# Patient Record
Sex: Female | Born: 1965
Health system: Southern US, Community
[De-identification: ages and names within clinical notes are randomized; demographics above are authoritative.]

## PROBLEM LIST (undated history)

## (undated) DIAGNOSIS — I10 Essential (primary) hypertension: Secondary | ICD-10-CM

## (undated) DIAGNOSIS — E785 Hyperlipidemia, unspecified: Secondary | ICD-10-CM

## (undated) DIAGNOSIS — N390 Urinary tract infection, site not specified: Secondary | ICD-10-CM

## (undated) HISTORY — DX: Hyperlipidemia, unspecified: E78.5

## (undated) HISTORY — DX: Urinary tract infection, site not specified: N39.0

## (undated) HISTORY — PX: HERNIA REPAIR: SHX51

---

## 1998-05-17 ENCOUNTER — Other Ambulatory Visit: Admission: RE | Admit: 1998-05-17 | Discharge: 1998-05-17 | Payer: Self-pay | Admitting: Gynecology

## 1999-06-17 ENCOUNTER — Other Ambulatory Visit: Admission: RE | Admit: 1999-06-17 | Discharge: 1999-06-17 | Payer: Self-pay | Admitting: Gynecology

## 2000-06-18 ENCOUNTER — Other Ambulatory Visit: Admission: RE | Admit: 2000-06-18 | Discharge: 2000-06-18 | Payer: Self-pay | Admitting: Gynecology

## 2001-02-18 ENCOUNTER — Emergency Department (HOSPITAL_COMMUNITY): Admission: EM | Admit: 2001-02-18 | Discharge: 2001-02-18 | Payer: Self-pay | Admitting: Emergency Medicine

## 2001-07-05 ENCOUNTER — Other Ambulatory Visit: Admission: RE | Admit: 2001-07-05 | Discharge: 2001-07-05 | Payer: Self-pay | Admitting: Gynecology

## 2002-12-20 ENCOUNTER — Other Ambulatory Visit: Admission: RE | Admit: 2002-12-20 | Discharge: 2002-12-20 | Payer: Self-pay | Admitting: Obstetrics and Gynecology

## 2003-05-29 ENCOUNTER — Encounter: Payer: Self-pay | Admitting: Obstetrics and Gynecology

## 2003-05-29 ENCOUNTER — Encounter: Admission: RE | Admit: 2003-05-29 | Discharge: 2003-05-29 | Payer: Self-pay | Admitting: Obstetrics and Gynecology

## 2003-07-16 ENCOUNTER — Emergency Department (HOSPITAL_COMMUNITY): Admission: EM | Admit: 2003-07-16 | Discharge: 2003-07-16 | Payer: Self-pay | Admitting: Emergency Medicine

## 2003-07-27 ENCOUNTER — Inpatient Hospital Stay (HOSPITAL_COMMUNITY): Admission: AD | Admit: 2003-07-27 | Discharge: 2003-07-27 | Payer: Self-pay | Admitting: Obstetrics and Gynecology

## 2004-02-27 ENCOUNTER — Inpatient Hospital Stay (HOSPITAL_COMMUNITY): Admission: AD | Admit: 2004-02-27 | Discharge: 2004-03-01 | Payer: Self-pay | Admitting: Obstetrics and Gynecology

## 2004-03-27 ENCOUNTER — Other Ambulatory Visit: Admission: RE | Admit: 2004-03-27 | Discharge: 2004-03-27 | Payer: Self-pay | Admitting: Obstetrics and Gynecology

## 2011-05-14 ENCOUNTER — Encounter: Payer: Self-pay | Admitting: Family Medicine

## 2011-05-14 DIAGNOSIS — N390 Urinary tract infection, site not specified: Secondary | ICD-10-CM | POA: Insufficient documentation

## 2013-06-24 ENCOUNTER — Encounter: Payer: Self-pay | Admitting: Family Medicine

## 2013-06-24 ENCOUNTER — Ambulatory Visit (INDEPENDENT_AMBULATORY_CARE_PROVIDER_SITE_OTHER): Payer: 59 | Admitting: Family Medicine

## 2013-06-24 VITALS — BP 120/74 | HR 86 | Temp 99.0°F | Resp 16 | Wt 160.0 lb

## 2013-06-24 DIAGNOSIS — R350 Frequency of micturition: Secondary | ICD-10-CM

## 2013-06-24 DIAGNOSIS — M545 Low back pain: Secondary | ICD-10-CM

## 2013-06-24 LAB — URINALYSIS, ROUTINE W REFLEX MICROSCOPIC
Bilirubin Urine: NEGATIVE
Glucose, UA: NEGATIVE mg/dL
Hgb urine dipstick: NEGATIVE
Leukocytes, UA: NEGATIVE

## 2013-06-24 MED ORDER — MELOXICAM 15 MG PO TABS: 15.0000 mg | ORAL_TABLET | Freq: Every day | ORAL | Status: DC

## 2013-06-24 MED ORDER — CYCLOBENZAPRINE HCL 10 MG PO TABS: 10.0000 mg | ORAL_TABLET | Freq: Three times a day (TID) | ORAL | Status: DC | PRN

## 2013-06-24 NOTE — Progress Notes (Signed)
Subjective:    Patient ID: Caitlin Henderson, female    DOB: March 31, 1966, 47 y.o.   MRN: [id number]  HPI Patient reports 2 weeks of low back pain in her right lower flank. It is worse with movement. It is worse with getting up from a seated position. It hurts to bend over. She denies any numbness or stinging radiating down either leg. She denies any symptoms of cauda equina syndrome. She denies any injury to her back. She denies any fevers or chills. She has had urinary frequency over last couple days. She denies any dysuria. She denies any hesitancy. She denies any pelvic pressure, nausea, vomiting. She saw her gynecologist 2 days ago where urinalysis was negative. Today in the office the urinalysis is also negative. See below: Office Visit on 06/24/2013  Component Date Value Range Status  . Color, Urine 06/24/2013 YELLOW  YELLOW Final  . APPearance 06/24/2013 CLEAR  CLEAR Final  . Specific Gravity, Urine 06/24/2013 1.020  1.005 - 1.030 Final  . pH 06/24/2013 7.0  5.0 - 8.0 Final  . Glucose, UA 06/24/2013 NEG  NEG mg/dL Final  . Bilirubin Urine 06/24/2013 NEG  NEG Final  . Ketones, ur 06/24/2013 NEG  NEG mg/dL Final  . Hgb urine dipstick 06/24/2013 NEG  NEG Final  . Protein, ur 06/24/2013 NEG  NEG mg/dL Final  . Urobilinogen, UA 06/24/2013 1  0.0 - 1.0 mg/dL Final  . Nitrite 40/98/1191 NEG  NEG Final  . Leukocytes, UA 06/24/2013 NEG  NEG Final   Past Medical History  Diagnosis Date  . Frequent UTI    No current outpatient prescriptions on file prior to visit.   No current facility-administered medications on file prior to visit.   Allergies  Allergen Reactions  . Bee Venom    History   Social History  . Marital Status: Married    Spouse Name: N/A    Number of Children: N/A  . Years of Education: N/A   Occupational History  . Not on file.   Social History Main Topics  . Smoking status: Never Smoker   . Smokeless tobacco: Not on file  . Alcohol Use: No  . Drug Use: No  .  Sexual Activity: Not on file   Other Topics Concern  . Not on file   Social History Narrative  . No narrative on file      Review of Systems  All other systems reviewed and are negative.       Objective:   Physical Exam  Vitals reviewed. Constitutional: She is oriented to person, place, and time.  Cardiovascular: Normal rate, regular rhythm and normal heart sounds.   No murmur heard. Pulmonary/Chest: Effort normal and breath sounds normal. No respiratory distress. She has no wheezes. She has no rales.  Abdominal: Soft. Bowel sounds are normal. She exhibits no distension. There is no tenderness. There is no rebound and no guarding.  Musculoskeletal:       Lumbar back: She exhibits tenderness, pain and spasm. She exhibits normal range of motion, no bony tenderness, no swelling and no edema.  Neurological: She is alert and oriented to person, place, and time. She has normal reflexes. She displays normal reflexes. No cranial nerve deficit. She exhibits normal muscle tone. Coordination normal.   no CVAT, patient has straight leg raise.        Assessment & Plan:  1. Frequent urination No evidence of UTI - Urinalysis, Routine w reflex microscopic  2. Low back pain Low back pain  appears to be a muscle strain. Recommend tincture of time for 2 weeks. Meanwhile begin meloxicam 15 mg by mouth daily and Flexeril 10 mg by mouth every 8 hours when necessary muscle spasm. Recheck in 2 weeks if no better or sooner if worse. Proceed with x-rays in 2 weeks if symptoms are no better. - meloxicam (MOBIC) 15 MG tablet; Take 1 tablet (15 mg total) by mouth daily.  Dispense: 30 tablet; Refill: 0 - cyclobenzaprine (FLEXERIL) 10 MG tablet; Take 1 tablet (10 mg total) by mouth 3 (three) times daily as needed for muscle spasms (take 1/2 a pill to a whole pill).  Dispense: 30 tablet; Refill: 0

## 2015-05-03 ENCOUNTER — Ambulatory Visit (INDEPENDENT_AMBULATORY_CARE_PROVIDER_SITE_OTHER): Payer: 59 | Admitting: Physician Assistant

## 2015-05-03 ENCOUNTER — Encounter: Payer: Self-pay | Admitting: Physician Assistant

## 2015-05-03 VITALS — BP 132/68 | HR 76 | Temp 98.1°F | Resp 14 | Ht 62.0 in | Wt 183.0 lb

## 2015-05-03 DIAGNOSIS — L200 Besnier's prurigo: Secondary | ICD-10-CM | POA: Diagnosis not present

## 2015-05-03 DIAGNOSIS — L239 Allergic contact dermatitis, unspecified cause: Secondary | ICD-10-CM

## 2015-05-03 MED ORDER — METHYLPREDNISOLONE ACETATE 40 MG/ML IJ SUSP
60.0000 mg | Freq: Once | INTRAMUSCULAR | Status: AC
  Administered 2015-05-03: 60 mg via INTRAMUSCULAR

## 2015-05-03 NOTE — Progress Notes (Signed)
    Patient ID: Tiyah Zelenak MRN: [id number], DOB: 04-Dec-1965, 49 y.o. Date of Encounter: 05/03/2015, 9:01 AM    Chief Complaint:  Chief Complaint  Patient presents with  . Skin Irriation    x2 days- thinks its poison ivy- has irritation on hands, wrist, arms, face and the corner of R eye     HPI: 49 y.o. year old white female presents with above. Has fine bumpy rash on both arms and hands. Also some splotches on her for head cheek and neck. Very itchy. Says that she is quite certain that she got into some poison ivy in her yard. Says that she prefers to get a shot if possible rather than having to take pills.     Home Meds:   Outpatient Prescriptions Prior to Visit  Medication Sig Dispense Refill  . sulfacetamide (BLEPH-10) 10 % ophthalmic ointment Place into both eyes every 6 (six) hours.    . cyclobenzaprine (FLEXERIL) 10 MG tablet Take 1 tablet (10 mg total) by mouth 3 (three) times daily as needed for muscle spasms (take 1/2 a pill to a whole pill). (Patient not taking: Reported on 05/03/2015) 30 tablet 0  . meloxicam (MOBIC) 15 MG tablet Take 1 tablet (15 mg total) by mouth daily. (Patient not taking: Reported on 05/03/2015) 30 tablet 0   No facility-administered medications prior to visit.    Allergies:  Allergies  Allergen Reactions  . Bee Venom       Review of Systems: See HPI for pertinent ROS. All other ROS negative.    Physical Exam: Blood pressure 132/68, pulse 76, temperature 98.1 F (36.7 C), temperature source Oral, resp. rate 14, height 5\' 2"  (1.575 m), weight 183 lb (83.008 kg), last menstrual period 04/08/2015., Body mass index is 33.46 kg/(m^2). General:  Overweight white female .Appears in no acute distress. Neck: Supple. No thyromegaly. No lymphadenopathy. Lungs: Clear bilaterally to auscultation without wheezes, rales, or rhonchi. Breathing is unlabored. Heart: Regular rhythm. No murmurs, rubs, or gallops. Msk:  Strength and tone normal for age. Skin:  Small, fine pink papules on both hands and  forearms and then only a few scattered on her upper arms. Also splotches of pink papules on her forehead and left cheek and left neck. Neuro: Alert and oriented X 3. Moves all extremities spontaneously. Gait is normal. CNII-XII grossly in tact. Psych:  Responds to questions appropriately with a normal affect.     ASSESSMENT AND PLAN:  49 y.o. year old female with    1. Allergic dermatitis Will give Depo-Medrol 60 mg IM now. Told her to call us if rash does not resolve or if it starts to resolve then returns. She says that she works at [employer]. Says that she will bring in FMLA form for me to complete. Says that she plans to just be out today but possibly tomorrow depending on how the medicine affects her. Says that when she brings in FMLA forms, she will provide me that information. Says that at her job she can turn in [place] forms even if she is only out for 1 day. - methylPREDNISolone acetate (DEPO-MEDROL) injection 60 mg; Inject 1.5 mLs (60 mg total) into the muscle once.   Signed, [address] [place], [place], [facility] 05/03/2015 9:01 AM

## 2015-05-07 ENCOUNTER — Telehealth: Payer: Self-pay | Admitting: Family Medicine

## 2015-05-07 NOTE — Telephone Encounter (Signed)
[phone] Pt dropped off FMLA ppw and it has been routed to [place]

## 2015-05-08 NOTE — Telephone Encounter (Signed)
Received FMLA ppw on my desk form pt, pt states the form is for work missed due to poison ivy rash on arms, hands and face, and rt eye.  Pt was out of work thurs 07/07 and Fri 07/08  Pt job title: Merchant navy officer   Job duties: office work, Teaching laboratory technician work, walking and sitting  Job hours: 8 hour shift 9-5p  I have routed the ppw to [employer]

## 2015-05-09 NOTE — Telephone Encounter (Signed)
Received ppw contacted pt to inform FMLA is ready for pickup and needs to pay the form fee.Caitlin Henderson

## 2015-05-09 NOTE — Telephone Encounter (Signed)
FMLA form completed. Given to [place] to copy to scan and to turn in to pt/employer.

## 2015-05-18 NOTE — Telephone Encounter (Signed)
piad

## 2015-06-07 ENCOUNTER — Ambulatory Visit (INDEPENDENT_AMBULATORY_CARE_PROVIDER_SITE_OTHER): Payer: 59 | Admitting: Physician Assistant

## 2015-06-07 ENCOUNTER — Encounter: Payer: Self-pay | Admitting: Physician Assistant

## 2015-06-07 VITALS — BP 140/82 | HR 88 | Temp 98.4°F | Resp 16 | Ht 62.0 in | Wt 183.0 lb

## 2015-06-07 DIAGNOSIS — I10 Essential (primary) hypertension: Secondary | ICD-10-CM | POA: Diagnosis not present

## 2015-06-07 MED ORDER — LISINOPRIL 10 MG PO TABS: 10.0000 mg | ORAL_TABLET | Freq: Every day | ORAL | Status: DC

## 2015-06-07 NOTE — Progress Notes (Signed)
    Patient ID: Caitlin Henderson MRN: [id number], DOB: 07-Jan-1966, 49 y.o. Date of Encounter: 06/07/2015, 4:54 PM    Chief Complaint:  Chief Complaint  Patient presents with  . Elevated BP    states that she has elevated BP for months (150's)     HPI: 49 y.o. year old white female presents with above.  Says that she works at [employer]. They occasionally check blood pressure there. Couple of times blood pressure readings there were 150s over 100.  Says that last month gynecologist did a hysteroscopy. Says that prior to and during the procedure her blood pressure was reading about 158/100 157/100. That when she went for post procedure follow-up blood pressure was 148/100.  Says she check blood pressure this morning and was 162/100.  Says that both of her parents have high blood pressure and her grandmothers have high blood pressure.     Home Meds:   Outpatient Prescriptions Prior to Visit  Medication Sig Dispense Refill  . sulfacetamide (BLEPH-10) 10 % ophthalmic ointment Place into both eyes every 6 (six) hours.     No facility-administered medications prior to visit.    Allergies:  Allergies  Allergen Reactions  . Bee Venom       Review of Systems: See HPI for pertinent ROS. All other ROS negative.    Physical Exam: Blood pressure 140/82, pulse 88, temperature 98.4 F (36.9 C), temperature source Oral, resp. rate 16, height 5\' 2"  (1.575 m), weight 183 lb (83.008 kg), last menstrual period 05/07/2015., Body mass index is 33.46 kg/(m^2). General:  WNWD WF. Appears in no acute distress. Neck: Supple. No thyromegaly. No lymphadenopathy. Lungs: Clear bilaterally to auscultation without wheezes, rales, or rhonchi. Breathing is unlabored. Heart: Regular rhythm. No murmurs, rubs, or gallops. Msk:  Strength and tone normal for age. Extremities/Skin: Warm and dry.  No edema.  Neuro: Alert and oriented X 3. Moves all extremities spontaneously. Gait is normal. CNII-XII  grossly in tact. Psych:  Responds to questions appropriately with a normal affect.     ASSESSMENT AND PLAN:  49 y.o. year old female with  1. Essential hypertension - lisinopril (PRINIVIL,ZESTRIL) 10 MG tablet; Take 1 tablet (10 mg total) by mouth daily.  Dispense: 30 tablet; Refill: 0 She is to take lisinopril 1 each morning. She is to schedule follow-up office visit here in 2 weeks to recheck blood pressure and also check BMP T on medication. Also recommend that she check blood pressure a couple of times at work a couple of days prior to that visit so that we will have several readings.  [address] [place], [place], [facility] 06/07/2015 4:54 PM

## 2015-06-10 ENCOUNTER — Encounter (HOSPITAL_COMMUNITY): Payer: Self-pay | Admitting: *Deleted

## 2015-06-10 ENCOUNTER — Emergency Department (HOSPITAL_COMMUNITY): Payer: 59

## 2015-06-10 ENCOUNTER — Emergency Department (HOSPITAL_COMMUNITY)
Admission: EM | Admit: 2015-06-10 | Discharge: 2015-06-10 | Disposition: A | Discharge status: Home / Self Care | Payer: 59 | Attending: Emergency Medicine | Admitting: Emergency Medicine

## 2015-06-10 DIAGNOSIS — Y9389 Activity, other specified: Secondary | ICD-10-CM | POA: Insufficient documentation

## 2015-06-10 DIAGNOSIS — Y998 Other external cause status: Secondary | ICD-10-CM | POA: Diagnosis not present

## 2015-06-10 DIAGNOSIS — S93402A Sprain of unspecified ligament of left ankle, initial encounter: Secondary | ICD-10-CM

## 2015-06-10 DIAGNOSIS — Z87440 Personal history of urinary (tract) infections: Secondary | ICD-10-CM | POA: Diagnosis not present

## 2015-06-10 DIAGNOSIS — W1839XA Other fall on same level, initial encounter: Secondary | ICD-10-CM | POA: Diagnosis not present

## 2015-06-10 DIAGNOSIS — Z79899 Other long term (current) drug therapy: Secondary | ICD-10-CM | POA: Insufficient documentation

## 2015-06-10 DIAGNOSIS — Y9289 Other specified places as the place of occurrence of the external cause: Secondary | ICD-10-CM | POA: Insufficient documentation

## 2015-06-10 DIAGNOSIS — S99912A Unspecified injury of left ankle, initial encounter: Secondary | ICD-10-CM | POA: Diagnosis present

## 2015-06-10 DIAGNOSIS — I10 Essential (primary) hypertension: Secondary | ICD-10-CM | POA: Diagnosis not present

## 2015-06-10 HISTORY — DX: Essential (primary) hypertension: I10

## 2015-06-10 NOTE — Discharge Instructions (Signed)

## 2015-06-10 NOTE — ED Provider Notes (Signed)
CSN: [id number]     Arrival date & time 06/10/15  1421 History   This chart was scribed for non-physician practitioner, Hollace Kinnier. Saunders Revel, working with Tanna Furry, MD by Terressa Koyanagi, ED Scribe. This patient was seen in room TR06C/TR06C and the patient's care was started at 4:18 PM.   Chief Complaint  Patient presents with  . Ankle Pain   The history is provided by the patient. No language interpreter was used.   PCP: Odette Fraction, MD HPI Comments: Caitlin Henderson is a 49 y.o. female who presents to the Emergency Department complaining of a fall with associated traumatic, sudden onset, 5/10 left ankle pain onset today at 1:30PM. Pt reports she is able to walk, however, notes it is very painful. Pt reports taking Excedrin at home without relief. Pt denies any other pain at this time.   Past Medical History  Diagnosis Date  . Frequent UTI   . Hypertension    Past Surgical History  Procedure Laterality Date  . Hernia repair     No family history on file. Social History  Substance Use Topics  . Smoking status: Never Smoker   . Smokeless tobacco: Never Used  . Alcohol Use: No   OB History    No data available     Review of Systems  Constitutional: Negative for chills.  Musculoskeletal: Positive for joint swelling (left ankle), arthralgias (left ankle) and gait problem (due to pain).  All other systems reviewed and are negative.  Allergies  Bee venom  Home Medications   Prior to Admission medications   Medication Sig Start Date End Date Taking? Authorizing Provider  aspirin-acetaminophen-caffeine (EXCEDRIN MIGRAINE) [phone] MG per tablet Take by mouth every 6 (six) hours as needed for headache.    Historical Provider, MD  lisinopril (PRINIVIL,ZESTRIL) 10 MG tablet Take 1 tablet (10 mg total) by mouth daily. 06/07/15   Orlena Sheldon, PA-C   Triage Vitals: BP 138/85 mmHg  Pulse 76  Temp(Src) 98.1 F (36.7 C) (Oral)  Resp 18  Ht 5\' 2"  (1.575 m)  Wt 183 lb (83.008  kg)  BMI 33.46 kg/m2  SpO2 100%  LMP 06/10/2015 Physical Exam  Constitutional: She is oriented to person, place, and time. She appears well-developed and well-nourished. No distress.  HENT:  Head: Normocephalic and atraumatic.  Eyes: Conjunctivae and EOM are normal.  Cardiovascular: Normal rate.   Pulmonary/Chest: Effort normal. No respiratory distress.  Musculoskeletal: Normal range of motion.  Left ankle diffusely swollen and tender to lateral malleoulus. Neurovascularly intact.   Neurological: She is alert and oriented to person, place, and time.  Skin: Skin is warm and dry.  Psychiatric: She has a normal mood and affect. Her behavior is normal.  Nursing note and vitals reviewed.  ED Course  Procedures (including critical care time) DIAGNOSTIC STUDIES: Oxygen Saturation is 100% on RA, nl by my interpretation.    COORDINATION OF CARE: 4:21 PM-Discussed treatment plan which includes discussing imaging results, RICE, brace placement with pt at bedside and pt agreed to plan. Pt also offered meds for pain, however, pt reports that she can manage her pain with Excedrin.   Labs Review Labs Reviewed - No data to display  Imaging Review Dg Ankle Complete Left  06/10/2015   CLINICAL DATA:  Left ankle pain and swelling  EXAM: LEFT ANKLE COMPLETE - 3+ VIEW  COMPARISON:  None.  FINDINGS: Generalized soft tissue swelling is noted. No acute fracture or dislocation is seen. No gross soft tissue abnormality  is noted.  IMPRESSION: Soft tissue swelling without acute bony abnormality.   Electronically Signed   By: Inez Catalina M.D.   On: 06/10/2015 15:56    EKG Interpretation None      MDM   Final diagnoses:  Ankle sprain, left, initial encounter    aso Follow up with Orthopaedist for recheck in 1 week     Fransico Meadow, PA-C 06/10/15 [place], MD 06/15/15 [phone]

## 2015-06-10 NOTE — ED Notes (Addendum)
[place].  She has pain in her left ankle.  She took excedrin prior to arrival.  Denies any other injuries

## 2015-07-10 ENCOUNTER — Other Ambulatory Visit: Payer: Self-pay | Admitting: Physician Assistant

## 2015-07-11 NOTE — Telephone Encounter (Signed)
Refill appropriate and filled per protocol. 

## 2015-07-13 ENCOUNTER — Encounter: Payer: Self-pay | Admitting: Family Medicine

## 2015-07-13 ENCOUNTER — Ambulatory Visit (INDEPENDENT_AMBULATORY_CARE_PROVIDER_SITE_OTHER): Payer: 59 | Admitting: Family Medicine

## 2015-07-13 VITALS — BP 130/74 | HR 84 | Temp 98.3°F | Resp 12 | Ht 62.0 in | Wt 182.0 lb

## 2015-07-13 DIAGNOSIS — I10 Essential (primary) hypertension: Secondary | ICD-10-CM | POA: Diagnosis not present

## 2015-07-13 DIAGNOSIS — R8290 Unspecified abnormal findings in urine: Secondary | ICD-10-CM | POA: Diagnosis not present

## 2015-07-13 DIAGNOSIS — J0100 Acute maxillary sinusitis, unspecified: Secondary | ICD-10-CM

## 2015-07-13 LAB — URINALYSIS, ROUTINE W REFLEX MICROSCOPIC
BILIRUBIN URINE: NEGATIVE
Glucose, UA: NEGATIVE
KETONES UR: NEGATIVE
Nitrite: NEGATIVE
PH: 6 (ref 5.0–8.0)
Protein, ur: NEGATIVE
SPECIFIC GRAVITY, URINE: 1.015 (ref 1.001–1.035)

## 2015-07-13 LAB — URINALYSIS, MICROSCOPIC ONLY
CASTS: NONE SEEN [LPF]
CRYSTALS: NONE SEEN [HPF]
YEAST: NONE SEEN [HPF]

## 2015-07-13 MED ORDER — FLUCONAZOLE 150 MG PO TABS: 150.0000 mg | ORAL_TABLET | Freq: Every day | ORAL | Status: DC

## 2015-07-13 MED ORDER — AMOXICILLIN-POT CLAVULANATE 875-125 MG PO TABS: 1.0000 | ORAL_TABLET | Freq: Two times a day (BID) | ORAL | Status: DC

## 2015-07-13 NOTE — Patient Instructions (Signed)
Increase water Take mucinex  We will call with lab results F/U as previous [facility]

## 2015-07-13 NOTE — Assessment & Plan Note (Signed)
BP improved, check CBC and Metabolic panel

## 2015-07-13 NOTE — Progress Notes (Signed)
Patient ID: Ireland Virrueta, female   DOB: 01-26-1966, 49 y.o.   MRN: [id number]   Subjective:    Patient ID: Brendalee Matthies, female    DOB: 1966-04-27, 49 y.o.   MRN: [id number]  Patient presents for Illness and HTN  patient here with sinus pressure drainage for the past week. She is history of sinusitis typically once or twice a year. She's been using over-the-counter medications with no improvement. She's not had any fever. Positive sick contact with her child. Drainage is now thick yellow and she is having pain in her teeth.  She was also briefly started on lisinopril 10 mg and needs a metabolic panel checked.  When discussing her symptoms she also noted that there is been an odor to her urine but she has not had any dysuria she does have history of recurrent UTI.No vaginal discharge     Review Of Systems:  GEN- denies fatigue, fever, weight loss,weakness, recent illness HEENT- denies eye drainage, change in vision, +nasal discharge, CVS- denies chest pain, palpitations RESP- denies SOB, cough, wheeze ABD- denies N/V, change in stools, abd pain GU- denies dysuria, hematuria, dribbling, incontinence MSK- denies joint pain, muscle aches, injury Neuro- denies headache, dizziness, syncope, seizure activity       Objective:    BP 130/74 mmHg  Pulse 84  Temp(Src) 98.3 F (36.8 C) (Oral)  Resp 12  Ht 5\' 2"  (1.575 m)  Wt 182 lb (82.555 kg)  BMI 33.28 kg/m2  LMP 07/05/2015 (Approximate) GEN- NAD, alert and oriented x3 HEENT- PERRL, EOMI, non injected sclera, pink conjunctiva, MMM, oropharynx mild injection, TM clear bilat no effusion,  + maxillary sinus tenderness, inflammed turbinates,  Nasal drainage  Neck- Supple, no LAD CVS- RRR, no murmur RESP-CTAB ABD-NABS,soft,NT,ND, no CVA tenderness  EXT- No edema Pulses- Radial 2+          Assessment & Plan:      Problem List Items Addressed This Visit    Essential hypertension - Primary    BP improved, check CBC and Metabolic  panel      Relevant Orders   Basic metabolic panel   CBC with Differential/Platelet    Other Visit Diagnoses    Abnormal urine odor        UA fairly neg, tace leukocytes, no true UTI symptoms, but augmentin should cover    Relevant Orders    Urinalysis, Routine w reflex microscopic (not at [facility]) (Completed)    Acute maxillary sinusitis, recurrence not specified        Treat with antibiotics,mucinex, does not tolerate nasal sprays, avoid decongestants with hypertesion    Relevant Medications    amoxicillin-clavulanate (AUGMENTIN) 875-125 MG per tablet    fluconazole (DIFLUCAN) 150 MG tablet       Note: This dictation was prepared with Dragon dictation along with smaller phrase technology. Any transcriptional errors that result from this process are unintentional.

## 2015-07-14 LAB — CBC WITH DIFFERENTIAL/PLATELET

## 2015-07-14 LAB — BASIC METABOLIC PANEL WITH GFR
BUN: 9 mg/dL (ref 7–25)
CO2: 22 mmol/L (ref 20–31)
Calcium: 9.2 mg/dL (ref 8.6–10.2)
Chloride: 105 mmol/L (ref 98–110)
Creat: 0.82 mg/dL (ref 0.50–1.10)
Glucose, Bld: 72 mg/dL (ref 70–99)
Potassium: 3.9 mmol/L (ref 3.5–5.3)
Sodium: 138 mmol/L (ref 135–146)

## 2015-11-19 ENCOUNTER — Other Ambulatory Visit: Payer: Self-pay | Admitting: Family Medicine

## 2015-11-19 NOTE — Telephone Encounter (Signed)
Refill appropriate and filled per protocol. 

## 2016-03-31 ENCOUNTER — Other Ambulatory Visit: Payer: Self-pay | Admitting: Family Medicine

## 2016-03-31 MED ORDER — LISINOPRIL 10 MG PO TABS: ORAL_TABLET | ORAL | Status: DC

## 2016-03-31 NOTE — Telephone Encounter (Signed)
Pt is requesting a refill of Lisinopril 10 mg [place]

## 2016-03-31 NOTE — Telephone Encounter (Signed)
Lisinopril refill sent to pharmacy 

## 2016-07-04 ENCOUNTER — Other Ambulatory Visit: Payer: Self-pay | Admitting: Family Medicine

## 2016-12-05 ENCOUNTER — Encounter: Payer: Self-pay | Admitting: Family Medicine

## 2016-12-05 ENCOUNTER — Ambulatory Visit (INDEPENDENT_AMBULATORY_CARE_PROVIDER_SITE_OTHER): Payer: 59 | Admitting: Family Medicine

## 2016-12-05 VITALS — BP 142/78 | HR 78 | Temp 99.0°F | Resp 14 | Ht 62.0 in | Wt 191.0 lb

## 2016-12-05 DIAGNOSIS — J029 Acute pharyngitis, unspecified: Secondary | ICD-10-CM

## 2016-12-05 DIAGNOSIS — J209 Acute bronchitis, unspecified: Secondary | ICD-10-CM | POA: Diagnosis not present

## 2016-12-05 DIAGNOSIS — Z20828 Contact with and (suspected) exposure to other viral communicable diseases: Secondary | ICD-10-CM

## 2016-12-05 LAB — INFLUENZA A AND B AG, IMMUNOASSAY
INFLUENZA A ANTIGEN: NOT DETECTED
INFLUENZA B ANTIGEN: NOT DETECTED

## 2016-12-05 MED ORDER — AMOXICILLIN 500 MG PO CAPS: 500.0000 mg | ORAL_CAPSULE | Freq: Two times a day (BID) | ORAL | 0 refills | Status: DC

## 2016-12-05 MED ORDER — GUAIFENESIN-CODEINE 100-10 MG/5ML PO SOLN: 5.0000 mL | Freq: Four times a day (QID) | ORAL | 0 refills | Status: DC | PRN

## 2016-12-05 NOTE — Patient Instructions (Signed)
Use cough medicine Take antibiotics for throat Salt water gargle, cough drops F/U as needed

## 2016-12-05 NOTE — Progress Notes (Signed)
   Subjective:    Patient ID: Caitlin Henderson, female    DOB: 05-Sep-1966, 51 y.o.   MRN: [id number]  Patient presents for Illness (x10 days- productive cough with yellow mucus, HA, chest congestion- denies fever/ muscla aches- positive exposure to flu)  Patient here with cough with production ,sore throat ,mmild headache congestion for the past 10 days. No significant fever.  She's been exposed to the flu He is been taking over-the-counter Coricdan, cough worsening, can not sleep and gets coughing fits Non smoker     Review Of Systems: per above   GEN- denies fatigue, fever, weight loss,weakness, recent illness HEENT- denies eye drainage, change in vision, nasal discharge, CVS- denies chest pain, palpitations RESP- denies SOB, +cough, wheeze ABD- denies N/V, change in stools, abd pain GU- denies dysuria, hematuria, dribbling, incontinence MSK- denies joint pain, muscle aches, injury Neuro+headache, denies dizziness, syncope, seizure activity       Objective:    BP (!) 142/78   Pulse 78   Temp 99 F (37.2 C) (Oral)   Resp 14   Ht 5\' 2"  (1.575 m)   Wt 191 lb (86.6 kg)   SpO2 98%   BMI 34.93 kg/m  GEN- NAD, alert and oriented x3 HEENT- PERRL, EOMI, non injected sclera, pink conjunctiva, MMM, oropharynx mild injection, no exudates TM clear bilat no effusion,  No  maxillary sinus tenderness, nares clear  Neck- Supple, + LAD CVS- RRR, no murmur RESP-CTAB EXT- No edema Pulses- Radial 2+  Flu neg      Assessment & Plan:      Problem List Items Addressed This Visit    None    Visit Diagnoses    Exposure to the flu    -  Primary   Relevant Orders   Influenza A and B Ag, Immunoassay (Completed)   Acute bronchitis, unspecified organism       Viral mediated, treat with robitussin codiene, humidifer at bedtime, cough drops    Pharyngitis, unspecified etiology       prolonged phargynitis. Flu Neg, treat with amox x 7 days      Note: This dictation was prepared with Dragon  dictation along with smaller phrase technology. Any transcriptional errors that result from this process are unintentional.

## 2016-12-08 ENCOUNTER — Ambulatory Visit: Payer: 59 | Admitting: Family Medicine

## 2016-12-10 ENCOUNTER — Ambulatory Visit
Admission: RE | Admit: 2016-12-10 | Discharge: 2016-12-10 | Disposition: A | Discharge status: Home / Self Care | Payer: 59 | Source: Ambulatory Visit | Attending: Family Medicine | Admitting: Family Medicine

## 2016-12-10 ENCOUNTER — Telehealth: Payer: Self-pay | Admitting: *Deleted

## 2016-12-10 DIAGNOSIS — R05 Cough: Secondary | ICD-10-CM | POA: Diagnosis not present

## 2016-12-10 DIAGNOSIS — R059 Cough, unspecified: Secondary | ICD-10-CM

## 2016-12-10 MED ORDER — PREDNISONE 20 MG PO TABS: 40.0000 mg | ORAL_TABLET | Freq: Every day | ORAL | 0 refills | Status: DC

## 2016-12-10 NOTE — Telephone Encounter (Signed)
Received call from patient.   States that she has been taking ABTx as prescribed, but she continues to have cough. States that she feels wore now than she did prior to coming in for OV.   MD please advise.

## 2016-12-10 NOTE — Telephone Encounter (Signed)
Send for CXR r/o PNA or fluid She can also start prednisone 40mg  po daily x 5 days to help with bronchial irritation

## 2016-12-10 NOTE — Telephone Encounter (Signed)
Call placed to patient and patient made aware.   Orders placed.

## 2017-02-26 ENCOUNTER — Emergency Department (HOSPITAL_COMMUNITY)
Admission: EM | Admit: 2017-02-26 | Discharge: 2017-02-26 | Disposition: A | Discharge status: Home / Self Care | Payer: 59 | Attending: Emergency Medicine | Admitting: Emergency Medicine

## 2017-02-26 ENCOUNTER — Encounter (HOSPITAL_COMMUNITY): Payer: Self-pay

## 2017-02-26 DIAGNOSIS — R05 Cough: Secondary | ICD-10-CM | POA: Diagnosis not present

## 2017-02-26 DIAGNOSIS — Y999 Unspecified external cause status: Secondary | ICD-10-CM | POA: Insufficient documentation

## 2017-02-26 DIAGNOSIS — W010XXA Fall on same level from slipping, tripping and stumbling without subsequent striking against object, initial encounter: Secondary | ICD-10-CM | POA: Diagnosis not present

## 2017-02-26 DIAGNOSIS — Y929 Unspecified place or not applicable: Secondary | ICD-10-CM | POA: Insufficient documentation

## 2017-02-26 DIAGNOSIS — Z7982 Long term (current) use of aspirin: Secondary | ICD-10-CM | POA: Diagnosis not present

## 2017-02-26 DIAGNOSIS — I10 Essential (primary) hypertension: Secondary | ICD-10-CM | POA: Insufficient documentation

## 2017-02-26 DIAGNOSIS — Y939 Activity, unspecified: Secondary | ICD-10-CM | POA: Insufficient documentation

## 2017-02-26 DIAGNOSIS — M62830 Muscle spasm of back: Secondary | ICD-10-CM | POA: Diagnosis not present

## 2017-02-26 DIAGNOSIS — Z79899 Other long term (current) drug therapy: Secondary | ICD-10-CM | POA: Insufficient documentation

## 2017-02-26 DIAGNOSIS — S3992XA Unspecified injury of lower back, initial encounter: Secondary | ICD-10-CM | POA: Diagnosis present

## 2017-02-26 MED ORDER — METHOCARBAMOL 500 MG PO TABS: 500.0000 mg | ORAL_TABLET | Freq: Three times a day (TID) | ORAL | 0 refills | Status: DC

## 2017-02-26 MED ORDER — IBUPROFEN 600 MG PO TABS
600.0000 mg | ORAL_TABLET | Freq: Four times a day (QID) | ORAL | 0 refills | Status: DC
Start: 2017-02-26 — End: 2018-11-23

## 2017-02-26 MED ORDER — METHOCARBAMOL 500 MG PO TABS
500.0000 mg | ORAL_TABLET | Freq: Once | ORAL | Status: AC
  Administered 2017-02-26: 500 mg via ORAL
  Filled 2017-02-26: qty 1

## 2017-02-26 MED ORDER — IBUPROFEN 400 MG PO TABS
400.0000 mg | ORAL_TABLET | Freq: Once | ORAL | Status: AC
  Administered 2017-02-26: 400 mg via ORAL
  Filled 2017-02-26: qty 1

## 2017-02-26 NOTE — ED Provider Notes (Signed)
[facility] DEPT Provider Note   CSN: [id number] Arrival date & time: 02/26/17  0855     History   Chief Complaint Chief Complaint  Patient presents with  . Back Pain    HPI Caitlin Henderson is a 51 y.o. female.  The history is provided by the patient.  Back Pain   This is a new problem. The current episode started yesterday. The problem has been gradually worsening. The pain is associated with falling (Tripped and injured thr right lower back. Pt also has been coughing frequently.Marland Kitchen). The pain is present in the lumbar spine. The quality of the pain is described as shooting and aching. The pain does not radiate. The pain is moderate. The symptoms are aggravated by certain positions. The pain is the same all the time. Pertinent negatives include no numbness, no headaches, no bowel incontinence, no perianal numbness and no bladder incontinence. She has tried nothing for the symptoms.    Past Medical History:  Diagnosis Date  . Frequent UTI   . Hypertension     Patient Active Problem List   Diagnosis Date Noted  . Essential hypertension 06/07/2015  . Frequent UTI     Past Surgical History:  Procedure Laterality Date  . HERNIA REPAIR      OB History    No data available       Home Medications    Prior to Admission medications   Medication Sig Start Date End Date Taking? Authorizing Provider  amoxicillin (AMOXIL) 500 MG capsule Take 1 capsule (500 mg total) by mouth 2 (two) times daily. 12/05/16   Alycia Rossetti, MD  aspirin-acetaminophen-caffeine (EXCEDRIN MIGRAINE) [phone] MG per tablet Take by mouth every 6 (six) hours as needed for headache.    Historical Provider, MD  guaiFENesin-codeine 100-10 MG/5ML syrup Take 5 mLs by mouth every 6 (six) hours as needed. 12/05/16   Alycia Rossetti, MD  lisinopril (PRINIVIL,ZESTRIL) 10 MG tablet TAKE ONE (1) TABLET EACH DAY 07/04/16   Susy Frizzle, MD  predniSONE (DELTASONE) 20 MG tablet Take 2 tablets (40 mg total) by mouth  daily with breakfast. x5 days 12/10/16   Alycia Rossetti, MD    Family History No family history on file.  Social History Social History  Substance Use Topics  . Smoking status: Never Smoker  . Smokeless tobacco: Never Used  . Alcohol use No     Allergies   Bee venom   Review of Systems Review of Systems  Respiratory: Positive for cough.   Gastrointestinal: Negative for bowel incontinence.  Genitourinary: Negative for bladder incontinence.  Musculoskeletal: Positive for back pain.  Neurological: Negative for numbness and headaches.  All other systems reviewed and are negative.    Physical Exam Updated Vital Signs BP 120/66 (BP Location: Right Arm)   Pulse 87   Temp 98.8 F (37.1 C) (Oral)   Resp 18   Ht 5\' 3"  (1.6 m)   Wt 86.2 kg   LMP 02/18/2017 (Approximate)   SpO2 100%   BMI 33.66 kg/m   Physical Exam   ED Treatments / Results  Labs (all labs ordered are listed, but only abnormal results are displayed) Labs Reviewed - No data to display  EKG  EKG Interpretation None       Radiology No results found.  Procedures Procedures (including critical care time)  Medications Ordered in ED Medications - No data to display   Initial Impression / Assessment and Plan / ED Course  I have reviewed the  triage vital signs and the nursing notes.  Pertinent labs & imaging results that were available during my care of the patient were reviewed by me and considered in my medical decision making (see chart for details).     *I have reviewed nursing notes, vital signs, and all appropriate lab and imaging results for this patient.**  Final Clinical Impressions(s) / ED Diagnoses MDM Patient is a 51 year old female who presents to the emergency department with right lower back area pain. She sustained a fall last evening, but did not have pain immediately. The patient had a cough this morning and has had a sharp pain that limits her motion since that time. The  examination is negative for acute neurologic deficits. I can reproduce the pain with certain range of motion exercises. The patient will be treated for muscle strain involving the right lumbar area. I've asked the patient to use a heating pad. Prescription for Robaxin and ibuprofen given to the patient. We discuss resting the back area. We also discussed seeing the primary physician or returning to the emergency department if any changes, problems, or concerns.    Final diagnoses:  Muscle spasm of back    New Prescriptions New Prescriptions   IBUPROFEN (ADVIL,MOTRIN) 600 MG TABLET    Take 1 tablet (600 mg total) by mouth 4 (four) times daily.   METHOCARBAMOL (ROBAXIN) 500 MG TABLET    Take 1 tablet (500 mg total) by mouth 3 (three) times daily.     Lily Kocher, PA-C 02/26/17 [place], MD 02/26/17 [phone]

## 2017-02-26 NOTE — Discharge Instructions (Signed)
Your vital signs within normal limits. There no acute neurologic deficits appreciated at this time on your examination. Your examination favors muscle strain involving the lower portion of your back (lumbar). Please use a heating pad when sitting and at rest. Please use Robaxin 3 times daily for spasm pain. Please use 600 mg of ibuprofen with breakfast, lunch, dinner, and at bedtime. Please see Dr. Dennard Schaumann, or return to the emergency department if any changes, problems, or concerns.

## 2017-02-26 NOTE — ED Triage Notes (Signed)
Pt reports she tripped going out of her front door last night and hurt her lower back a little bit.  This morning she was coughing and suddenly felt a warm sensation in r lower back and sharp pain.  Reports pain is nonradiating.

## 2017-03-19 ENCOUNTER — Encounter: Payer: Self-pay | Admitting: Family Medicine

## 2017-05-08 ENCOUNTER — Telehealth: Payer: Self-pay | Admitting: Family Medicine

## 2017-05-08 NOTE — Telephone Encounter (Signed)
FMLA paperwork is not for this pt- this pt does not need FMLA

## 2017-05-08 NOTE — Telephone Encounter (Signed)
Received fmla papers will route these to [facility]

## 2017-06-30 ENCOUNTER — Encounter: Payer: Self-pay | Admitting: Family Medicine

## 2017-07-16 ENCOUNTER — Other Ambulatory Visit: Payer: Self-pay | Admitting: Family Medicine

## 2017-07-16 NOTE — Telephone Encounter (Signed)
Medication filled x1 with no refills.   Requires office visit before any further refills can be given.   Letter sent.  

## 2017-08-10 ENCOUNTER — Encounter: Payer: Self-pay | Admitting: Family Medicine

## 2017-08-10 ENCOUNTER — Ambulatory Visit (INDEPENDENT_AMBULATORY_CARE_PROVIDER_SITE_OTHER): Payer: 59 | Admitting: Family Medicine

## 2017-08-10 VITALS — BP 110/70 | HR 84 | Temp 97.8°F | Resp 16 | Ht 63.0 in | Wt 196.0 lb

## 2017-08-10 DIAGNOSIS — I10 Essential (primary) hypertension: Secondary | ICD-10-CM | POA: Diagnosis not present

## 2017-08-10 DIAGNOSIS — M722 Plantar fascial fibromatosis: Secondary | ICD-10-CM | POA: Diagnosis not present

## 2017-08-10 LAB — COMPLETE METABOLIC PANEL WITH GFR
AG Ratio: 1.7 (calc) (ref 1.0–2.5)
ALBUMIN MSPROF: 4.5 g/dL (ref 3.6–5.1)
ALKALINE PHOSPHATASE (APISO): 93 U/L (ref 33–130)
ALT: 9 U/L (ref 6–29)
AST: 13 U/L (ref 10–35)
BILIRUBIN TOTAL: 0.5 mg/dL (ref 0.2–1.2)
BUN: 8 mg/dL (ref 7–25)
CHLORIDE: 107 mmol/L (ref 98–110)
CO2: 27 mmol/L (ref 20–32)
Calcium: 9.6 mg/dL (ref 8.6–10.4)
Creat: 0.9 mg/dL (ref 0.50–1.05)
GFR, EST AFRICAN AMERICAN: 86 mL/min/{1.73_m2} (ref >=60)
GFR, EST NON AFRICAN AMERICAN: 75 mL/min/{1.73_m2} (ref >=60)
GLOBULIN: 2.6 g/dL (ref 1.9–3.7)
Glucose, Bld: 94 mg/dL (ref 65–99)
POTASSIUM: 4.5 mmol/L (ref 3.5–5.3)
SODIUM: 142 mmol/L (ref 135–146)
Total Protein: 7.1 g/dL (ref 6.1–8.1)

## 2017-08-10 LAB — LIPID PANEL
Cholesterol: 213 mg/dL — ABNORMAL HIGH (ref <200)
HDL: 41 mg/dL — ABNORMAL LOW (ref >50)
LDL Cholesterol (Calc): 149 mg/dL (calc) — ABNORMAL HIGH
NON-HDL CHOLESTEROL (CALC): 172 mg/dL — AB (ref <130)
TRIGLYCERIDES: 109 mg/dL (ref <150)
Total CHOL/HDL Ratio: 5.2 (calc) — ABNORMAL HIGH (ref <5.0)

## 2017-08-10 MED ORDER — LISINOPRIL 10 MG PO TABS: ORAL_TABLET | ORAL | 11 refills | Status: DC

## 2017-08-10 MED ORDER — DICLOFENAC SODIUM 75 MG PO TBEC: 75.0000 mg | DELAYED_RELEASE_TABLET | Freq: Two times a day (BID) | ORAL | 0 refills | Status: DC

## 2017-08-10 NOTE — Progress Notes (Signed)
   Subjective:    Patient ID: Caitlin Henderson, female    DOB: [birth year], 51 y.o.   MRN: [id number]  HPI Patient reports more than 6 months of pain in both feet near the insertion of the plantar fascia on the plantar surface of the calcaneus. She believes she has plantar fasciitis and has been performing stretches on a daily basis without benefit.  She is also here to recheck her blood pressure. She is currently taking lisinopril 10 mg a day. She denies any cough or chest pain or dyspnea on exertion. She declines a flu shot. She is overdue for fasting lab work Past Medical History:  Diagnosis Date  . Frequent UTI   . Hypertension    Past Surgical History:  Procedure Laterality Date  . HERNIA REPAIR     Current Outpatient Prescriptions on File Prior to Visit  Medication Sig Dispense Refill  . aspirin-acetaminophen-caffeine (EXCEDRIN MIGRAINE) 250-250-65 MG per tablet Take by mouth every 6 (six) hours as needed for headache.    . ibuprofen (ADVIL,MOTRIN) 600 MG tablet Take 1 tablet (600 mg total) by mouth 4 (four) times daily. 30 tablet 0   No current facility-administered medications on file prior to visit.    Allergies  Allergen Reactions  . Bee Venom    Social History   Social History  . Marital status: Married    Spouse name: N/A  . Number of children: N/A  . Years of education: N/A   Occupational History  . Not on file.   Social History Main Topics  . Smoking status: Never Smoker  . Smokeless tobacco: Never Used  . Alcohol use No  . Drug use: No  . Sexual activity: Not on file   Other Topics Concern  . Not on file   Social History Narrative  . No narrative on file     Review of Systems  All other systems reviewed and are negative.      Objective:   Physical Exam  Cardiovascular: Normal rate, regular rhythm and normal heart sounds.   No murmur heard. Pulmonary/Chest: Effort normal and breath sounds normal. No respiratory distress. She has no wheezes. She has  no rales. She exhibits no tenderness.  Musculoskeletal:       Right foot: There is tenderness and bony tenderness.       Left foot: There is tenderness and bony tenderness.       Feet:  Vitals reviewed.         Assessment & Plan:  Benign essential HTN - Plan: COMPLETE METABOLIC PANEL WITH GFR, Lipid panel  Plantar fasciitis, bilateral - Plan: Ambulatory referral to Podiatry  Recommended night splints in addition to her stretching and diclofenac 75 mg pobid for inflammation.  Consult podiatry for cortisone shots.  Continue lisinopril 10 mg poqday as bp is well controlled.  Check flp and cmp.  Offered flu shot but declined.

## 2017-08-11 ENCOUNTER — Encounter: Payer: Self-pay | Admitting: Family Medicine

## 2017-08-18 DIAGNOSIS — Z1231 Encounter for screening mammogram for malignant neoplasm of breast: Secondary | ICD-10-CM | POA: Diagnosis not present

## 2017-08-18 DIAGNOSIS — Z124 Encounter for screening for malignant neoplasm of cervix: Secondary | ICD-10-CM | POA: Diagnosis not present

## 2017-08-18 DIAGNOSIS — Z01419 Encounter for gynecological examination (general) (routine) without abnormal findings: Secondary | ICD-10-CM | POA: Diagnosis not present

## 2017-08-20 LAB — HM PAP SMEAR

## 2017-10-19 ENCOUNTER — Other Ambulatory Visit: Payer: Self-pay | Admitting: Family Medicine

## 2017-10-19 MED ORDER — LISINOPRIL 10 MG PO TABS: ORAL_TABLET | ORAL | 3 refills | Status: DC

## 2017-10-29 ENCOUNTER — Ambulatory Visit (INDEPENDENT_AMBULATORY_CARE_PROVIDER_SITE_OTHER): Payer: 59

## 2017-10-29 ENCOUNTER — Ambulatory Visit: Payer: 59 | Admitting: Podiatry

## 2017-10-29 ENCOUNTER — Encounter: Payer: Self-pay | Admitting: Podiatry

## 2017-10-29 ENCOUNTER — Other Ambulatory Visit: Payer: Self-pay | Admitting: Podiatry

## 2017-10-29 DIAGNOSIS — M79672 Pain in left foot: Secondary | ICD-10-CM

## 2017-10-29 DIAGNOSIS — M722 Plantar fascial fibromatosis: Secondary | ICD-10-CM | POA: Diagnosis not present

## 2017-10-29 DIAGNOSIS — M79671 Pain in right foot: Secondary | ICD-10-CM

## 2017-10-29 MED ORDER — MELOXICAM 15 MG PO TABS: 15.0000 mg | ORAL_TABLET | Freq: Every day | ORAL | 2 refills | Status: DC

## 2017-10-29 MED ORDER — TRIAMCINOLONE ACETONIDE 10 MG/ML IJ SUSP
10.0000 mg | Freq: Once | INTRAMUSCULAR | Status: AC
  Administered 2017-10-29: 10 mg

## 2017-10-29 NOTE — Patient Instructions (Signed)

## 2017-10-29 NOTE — Progress Notes (Signed)
Subjective:   Patient ID: Caitlin Henderson, female   DOB: 52 y.o.   MRN: [id number]   HPI Patient presents with 1 year history of significant discomfort in the plantar heel region bilateral and is tried over-the-counter inserts stretching and ice therapy.  Patient does not smoke and likes to be active   Review of Systems  All other systems reviewed and are negative.       Objective:  Physical Exam  Constitutional: She appears well-developed and well-nourished.  Cardiovascular: Intact distal pulses.  Pulmonary/Chest: Effort normal.  Musculoskeletal: Normal range of motion.  Neurological: She is alert.  Skin: Skin is warm.  Nursing note and vitals reviewed.   Discomfort in the plantar fascial bilateral with inflammation fluid around the medial band.  Neurovascular status found to be intact muscle strength adequate range of motion is within normal limits with patient found to have patient is noted to have good digital perfusion is well oriented x3 with moderate depression of the arch with high arched nonweightbearing foot structure     Assessment:  Acute plantar fasciitis bilateral heel with inflammation     Plan:  H&P condition reviewed x-rays reviewed with patient.  I injected the plantar fascial bilateral 3 mg Kenalog 5 mg Xylocaine and patient did not want fascial brace is currently and placed on diclofenac 75 mg twice daily.  Given instructions on physical therapy anti-inflammatories and patient will be seen back in the next several weeks  X-rays indicate small spur with no indications of stress fracture arthritis

## 2017-10-29 NOTE — Progress Notes (Signed)
   Subjective:    Patient ID: Caitlin Henderson, female    DOB: June 17, 1966, 52 y.o.   MRN: [id number]  HPI    Review of Systems  All other systems reviewed and are negative.      Objective:   Physical Exam        Assessment & Plan:

## 2017-11-12 ENCOUNTER — Ambulatory Visit: Payer: 59 | Admitting: Podiatry

## 2017-11-12 ENCOUNTER — Encounter: Payer: Self-pay | Admitting: Podiatry

## 2017-11-12 DIAGNOSIS — M722 Plantar fascial fibromatosis: Secondary | ICD-10-CM

## 2017-11-12 NOTE — Progress Notes (Signed)
Subjective:   Patient ID: Caitlin Henderson, female   DOB: 52 y.o.   MRN: [id number]   HPI Patient states she is feeling a lot better with minimal discomfort on the left heel no pain on the right heel and minimal pain when I palpated the feet   ROS      Objective:  Physical Exam  Neurovascular status intact with significant diminishment of discomfort plantar heel with mild discomfort still noted left improved plantar fasciitis     Assessment:  Reviewed condition and recommended anti-inflammatories physical therapy and supportive shoes and if symptoms persist may require further injection or more aggressive treatment.  Reappoint as needed     Plan:  Plan as listed above

## 2018-02-10 DIAGNOSIS — N63 Unspecified lump in unspecified breast: Secondary | ICD-10-CM | POA: Diagnosis not present

## 2018-10-06 DIAGNOSIS — N925 Other specified irregular menstruation: Secondary | ICD-10-CM | POA: Diagnosis not present

## 2018-11-08 ENCOUNTER — Other Ambulatory Visit: Payer: Self-pay | Admitting: Family Medicine

## 2018-11-23 ENCOUNTER — Encounter: Payer: Self-pay | Admitting: Family Medicine

## 2018-11-23 ENCOUNTER — Other Ambulatory Visit: Payer: Self-pay

## 2018-11-23 ENCOUNTER — Ambulatory Visit: Payer: 59 | Admitting: Family Medicine

## 2018-11-23 VITALS — BP 130/72 | HR 78 | Temp 99.1°F | Resp 12 | Ht 63.0 in | Wt 194.0 lb

## 2018-11-23 DIAGNOSIS — Z1211 Encounter for screening for malignant neoplasm of colon: Secondary | ICD-10-CM

## 2018-11-23 DIAGNOSIS — J019 Acute sinusitis, unspecified: Secondary | ICD-10-CM | POA: Diagnosis not present

## 2018-11-23 DIAGNOSIS — I10 Essential (primary) hypertension: Secondary | ICD-10-CM

## 2018-11-23 DIAGNOSIS — E669 Obesity, unspecified: Secondary | ICD-10-CM

## 2018-11-23 DIAGNOSIS — Z23 Encounter for immunization: Secondary | ICD-10-CM

## 2018-11-23 MED ORDER — AMOXICILLIN 875 MG PO TABS: 875.0000 mg | ORAL_TABLET | Freq: Two times a day (BID) | ORAL | 0 refills | Status: DC

## 2018-11-23 MED ORDER — FLUCONAZOLE 150 MG PO TABS: 1.0000 mg | ORAL_TABLET | Freq: Once | ORAL | 1 refills | Status: AC

## 2018-11-23 MED ORDER — LISINOPRIL 10 MG PO TABS: ORAL_TABLET | ORAL | 3 refills | Status: DC

## 2018-11-23 NOTE — Patient Instructions (Addendum)
Nasal saline or flonase Take antibiotics  Diflucan given  Corcidan for  BHP or robitussin DM TDAP given  F/U 1 year

## 2018-11-23 NOTE — Progress Notes (Signed)
   Subjective:    Patient ID: Caitlin Henderson, female    DOB: 11-12-1965, 53 y.o.   MRN: [id number]  Patient presents for Illness (x1 week- sinus pressure, nasal drainage, low grade fever, productive cough with yellow mucus) Pt here for acute visit but has not had CPE, overdue on labs/screenings  Pt here with sinus pressure and drainage, for 10 days. She went to [place] and came back sick, has cough with post nasal drip. She has some productive cough, low grade fever.  Hoarse voice, sore throat. She does have history of sinus infections Taking aleve and nyquil No meds this morning  No GI symptoms   HTN- taking lisinopril 10mg  once a day     - has Biometric screening with [employer] next month    GYN- [facility]- Dr Philis Pique    Due for TDAP   Due for colonoscopy    Review Of Systems:  GEN- denies fatigue, fever, weight loss,weakness, recent illness HEENT- denies eye drainage, change in vision,+ nasal discharge, CVS- denies chest pain, palpitations RESP- denies SOB, +cough, wheeze ABD- denies N/V, change in stools, abd pain GU- denies dysuria, hematuria, dribbling, incontinence MSK- denies joint pain, muscle aches, injury Neuro- +headache, denies dizziness, syncope, seizure activity       Objective:    BP 130/72   Pulse 78   Temp 99.1 F (37.3 C) (Oral)   Resp 12   Ht 5\' 3"  (1.6 m)   Wt 194 lb (88 kg)   SpO2 96%   BMI 34.37 kg/m  GEN- NAD, alert and oriented x3,obese  HEENT- PERRL, EOMI, non injected sclera, pink conjunctiva, MMM, oropharynx mild injection, TM clear bilat no effusion,  + maxillary sinus tenderness, inflammed turbinates,  Nasal drainage  Neck- Supple, shotty  LAD CVS- RRR, no murmur RESP-CTAB EXT- No edema Pulses- Radial 2+         Assessment & Plan:      Problem List Items Addressed This Visit      Unprioritized   Essential hypertension    Controlled, check CBC, CMET Lipids to be done at her biometric screening at work      Relevant  Medications   lisinopril (PRINIVIL,ZESTRIL) 10 MG tablet   Other Relevant Orders   CBC with Differential/Platelet (Completed)   Comprehensive metabolic panel (Completed)   Obesity (BMI 30.0-34.9)    Other Visit Diagnoses    Acute rhinosinusitis    -  Primary   Treat with amoxicillin, nasal saline, switch to coricidan HBP or robitssin DM due to HTN   Relevant Medications   amoxicillin (AMOXIL) 875 MG tablet   Need for tetanus, diphtheria, and acellular pertussis (Tdap) vaccine in patient of adolescent age or older       Relevant Orders   Tdap vaccine greater than or equal to 7yo IM (Completed)   Colon cancer screening       Referral to GI      Note: This dictation was prepared with Dragon dictation along with smaller phrase technology. Any transcriptional errors that result from this process are unintentional.

## 2018-11-24 ENCOUNTER — Encounter: Payer: Self-pay | Admitting: Family Medicine

## 2018-11-24 ENCOUNTER — Encounter (INDEPENDENT_AMBULATORY_CARE_PROVIDER_SITE_OTHER): Payer: Self-pay | Admitting: *Deleted

## 2018-11-24 ENCOUNTER — Encounter: Payer: Self-pay | Admitting: *Deleted

## 2018-11-24 DIAGNOSIS — E669 Obesity, unspecified: Secondary | ICD-10-CM | POA: Insufficient documentation

## 2018-11-24 DIAGNOSIS — E66812 Obesity, class 2: Secondary | ICD-10-CM | POA: Insufficient documentation

## 2018-11-24 LAB — CBC WITH DIFFERENTIAL/PLATELET
ABSOLUTE MONOCYTES: 728 {cells}/uL (ref 200–950)
BASOS PCT: 0.8 %
Basophils Absolute: 73 cells/uL (ref 0–200)
EOS ABS: 137 {cells}/uL (ref 15–500)
Eosinophils Relative: 1.5 %
HCT: 39.1 % (ref 35.0–45.0)
Hemoglobin: 13.1 g/dL (ref 11.7–15.5)
LYMPHS ABS: 2584 {cells}/uL (ref 850–3900)
MCH: 29 pg (ref 27.0–33.0)
MCHC: 33.5 g/dL (ref 32.0–36.0)
MCV: 86.7 fL (ref 80.0–100.0)
MPV: 11 fL (ref 7.5–12.5)
Monocytes Relative: 8 %
Neutro Abs: 5578 cells/uL (ref 1500–7800)
Neutrophils Relative %: 61.3 %
PLATELETS: 358 10*3/uL (ref 140–400)
RBC: 4.51 10*6/uL (ref 3.80–5.10)
RDW: 13.2 % (ref 11.0–15.0)
TOTAL LYMPHOCYTE: 28.4 %
WBC: 9.1 10*3/uL (ref 3.8–10.8)

## 2018-11-24 LAB — COMPREHENSIVE METABOLIC PANEL
AG Ratio: 1.7 (calc) (ref 1.0–2.5)
ALKALINE PHOSPHATASE (APISO): 92 U/L (ref 33–130)
ALT: 12 U/L (ref 6–29)
AST: 13 U/L (ref 10–35)
Albumin: 4.4 g/dL (ref 3.6–5.1)
BILIRUBIN TOTAL: 0.5 mg/dL (ref 0.2–1.2)
BUN: 9 mg/dL (ref 7–25)
CALCIUM: 9.6 mg/dL (ref 8.6–10.4)
CO2: 26 mmol/L (ref 20–32)
CREATININE: 0.91 mg/dL (ref 0.50–1.05)
Chloride: 105 mmol/L (ref 98–110)
Globulin: 2.6 g/dL (calc) (ref 1.9–3.7)
Glucose, Bld: 73 mg/dL (ref 65–99)
Potassium: 4.5 mmol/L (ref 3.5–5.3)
Sodium: 141 mmol/L (ref 135–146)
TOTAL PROTEIN: 7 g/dL (ref 6.1–8.1)

## 2018-11-24 NOTE — Assessment & Plan Note (Signed)
Controlled, check CBC, CMET Lipids to be done at her biometric screening at work

## 2018-11-26 ENCOUNTER — Encounter: Payer: Self-pay | Admitting: *Deleted

## 2018-12-23 IMAGING — DX DG CHEST 2V
2 series · 2 of 2 positions shown · non-contrast
Comparison: No recent prior .

CLINICAL DATA: Productive cough.

EXAM:
CHEST  2 VIEW

[dg chest 2 view (1 of 2)]
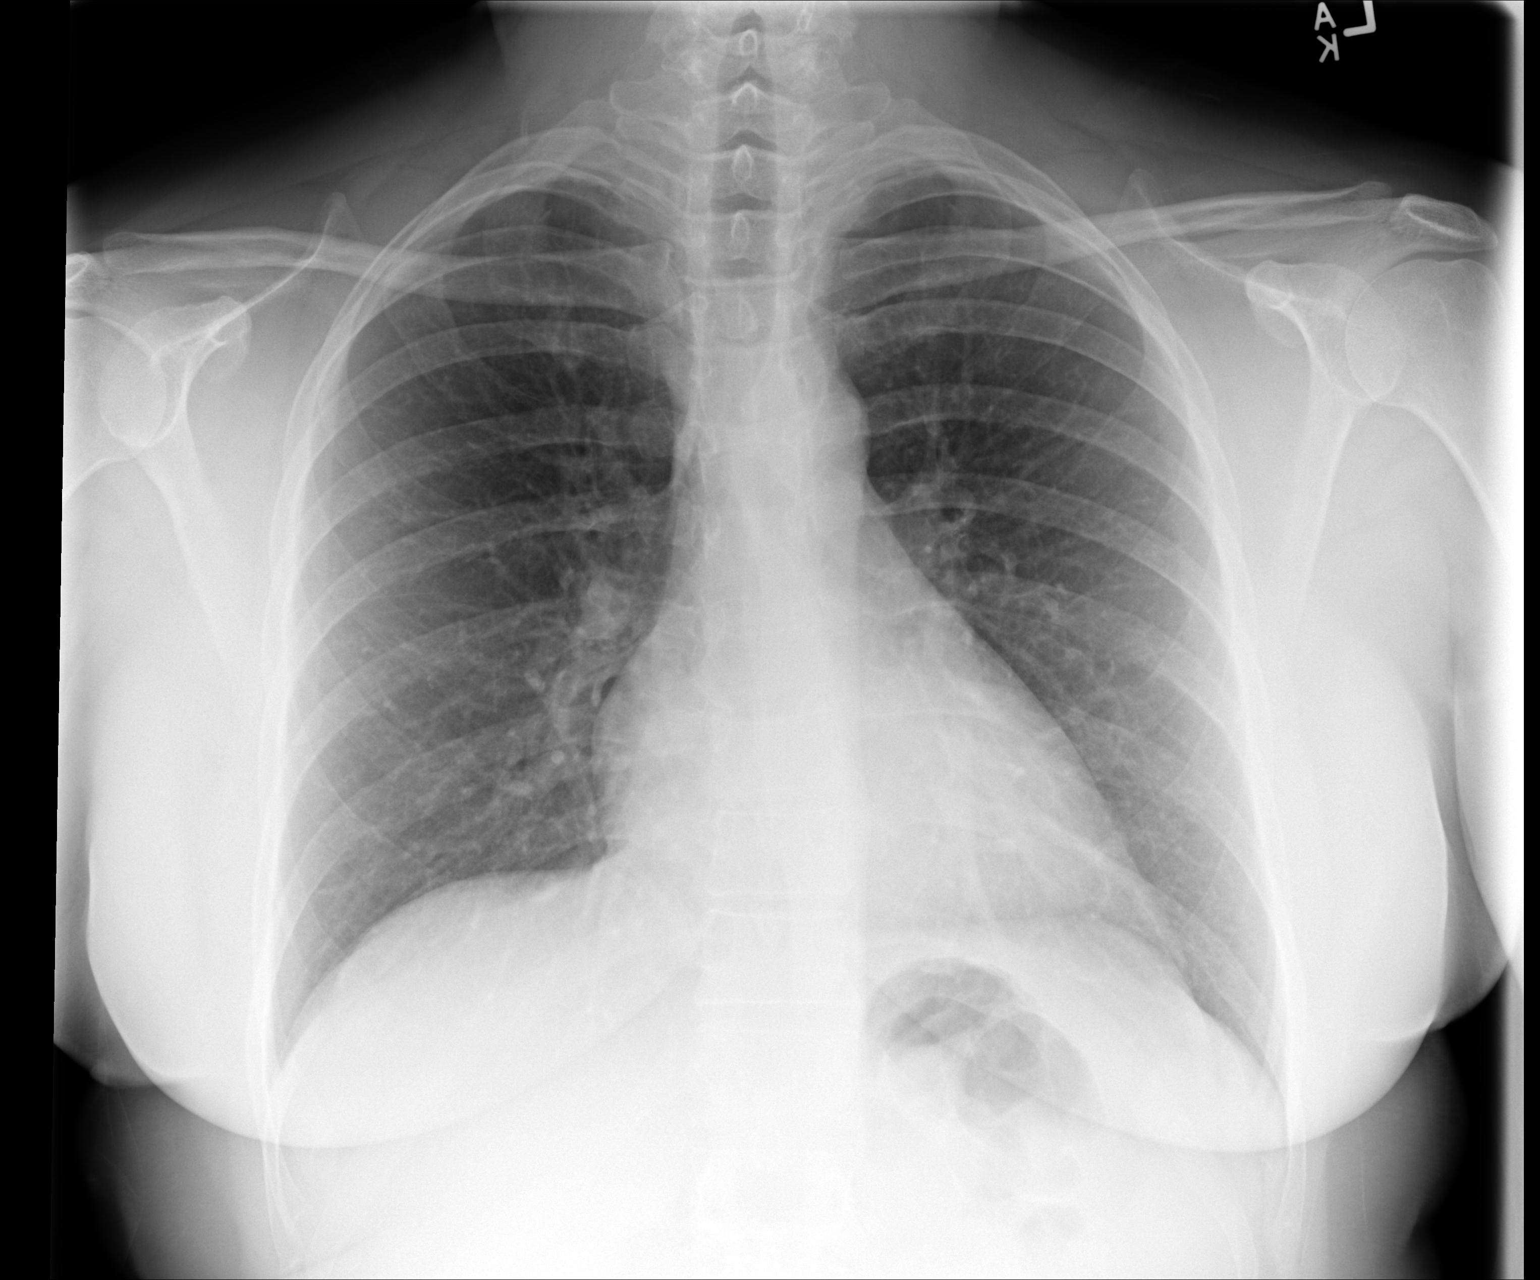

[dg chest 2 view (2 of 2)]
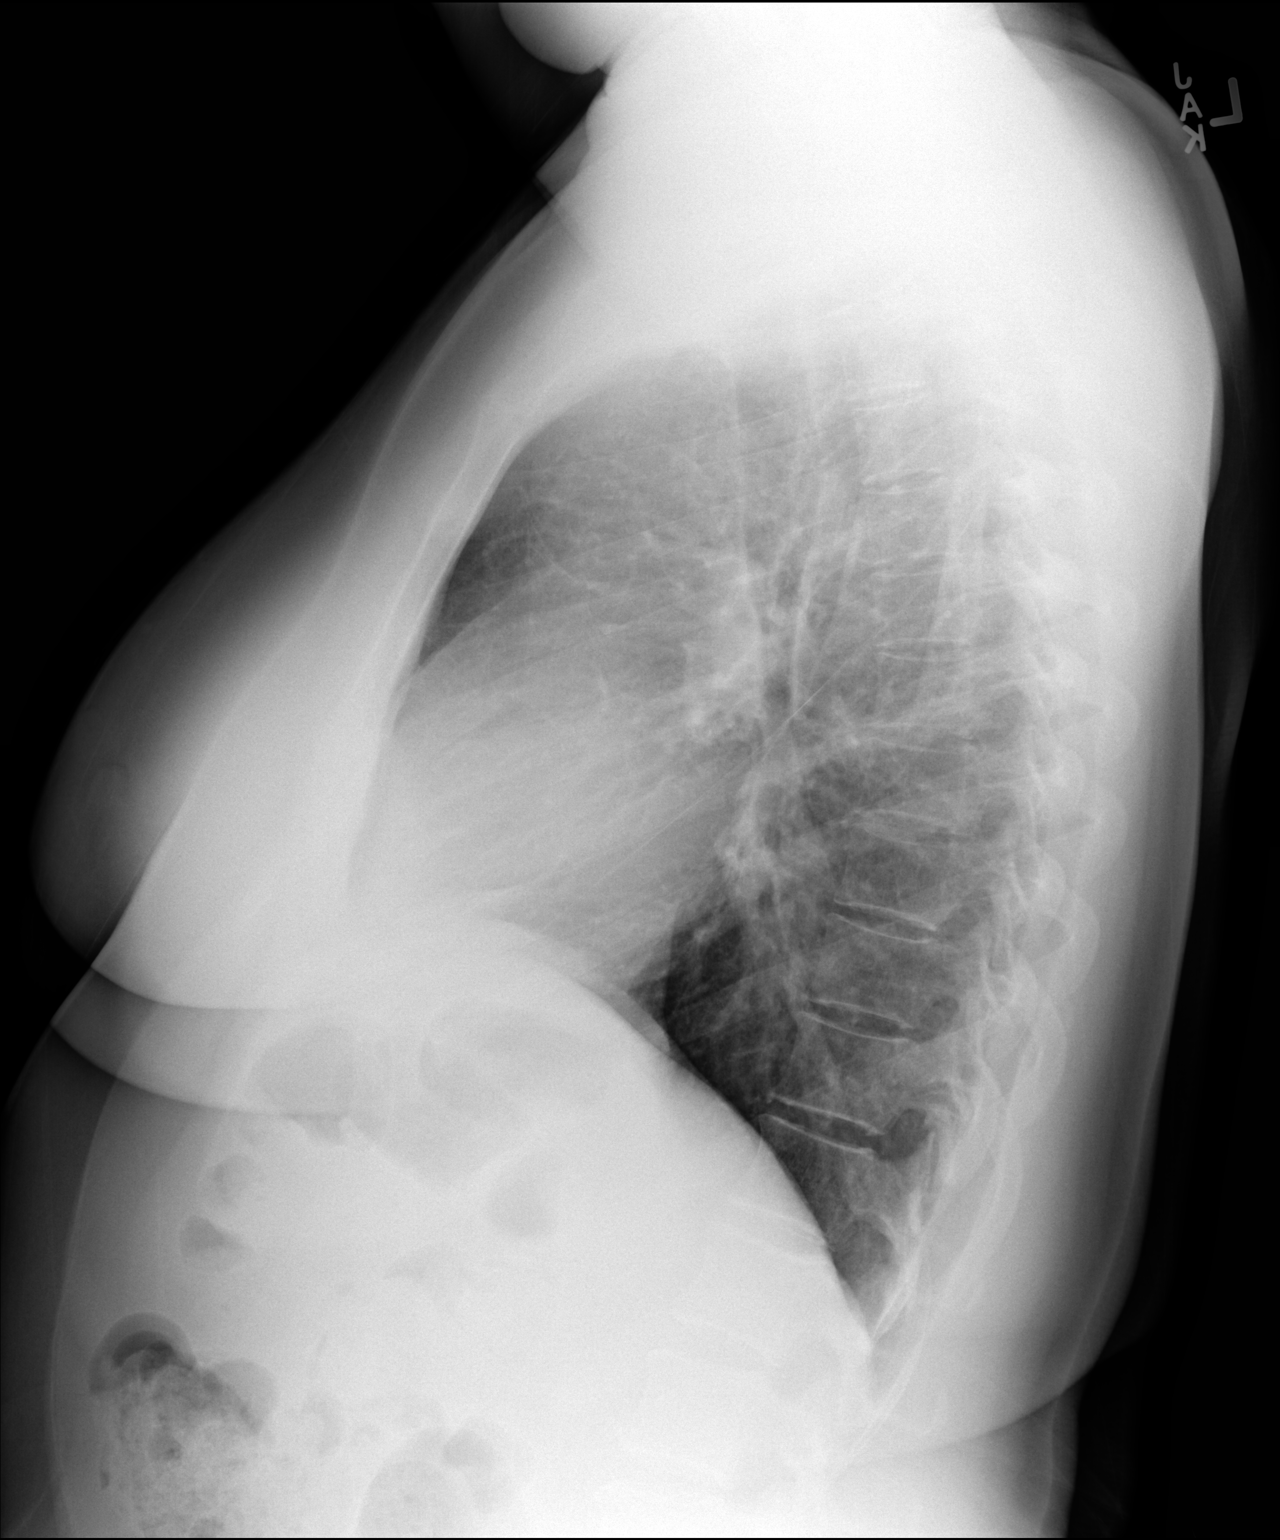

[2 of 2 positions shown; findings below may reference images not displayed]

FINDINGS: Mediastinum and hilar structures normal. Lungs are clear. No pleural
effusion or pneumothorax. Heart size normal. No acute bony
abnormality .
IMPRESSION: No acute cardiopulmonary disease .

## 2019-05-10 ENCOUNTER — Other Ambulatory Visit: Payer: Self-pay

## 2019-05-10 ENCOUNTER — Other Ambulatory Visit: Payer: 59

## 2019-05-10 DIAGNOSIS — Z20822 Contact with and (suspected) exposure to covid-19: Secondary | ICD-10-CM

## 2019-05-15 LAB — NOVEL CORONAVIRUS, NAA: SARS-CoV-2, NAA: NOT DETECTED

## 2019-05-16 ENCOUNTER — Telehealth: Payer: Self-pay | Admitting: Family Medicine

## 2019-05-16 NOTE — Telephone Encounter (Signed)
Patient called in and received her results

## 2019-05-17 ENCOUNTER — Encounter: Payer: Self-pay | Admitting: Family Medicine

## 2019-05-17 ENCOUNTER — Telehealth: Payer: Self-pay | Admitting: Family Medicine

## 2019-05-17 NOTE — Telephone Encounter (Signed)
Pt called and states that her and her husband has to go for COVID testing for possible exposure and her test was negative and she needs a work note to return to work. Ok to write?

## 2019-05-17 NOTE — Telephone Encounter (Signed)
Okay to return to work Husband also negative covid No current symptoms

## 2019-05-18 NOTE — Telephone Encounter (Signed)
Letter written, signed and left up front. Pt aware to pick up via vm

## 2019-08-17 ENCOUNTER — Other Ambulatory Visit: Payer: Self-pay

## 2019-08-17 ENCOUNTER — Ambulatory Visit: Payer: 59 | Admitting: Podiatry

## 2019-08-17 ENCOUNTER — Encounter: Payer: Self-pay | Admitting: Podiatry

## 2019-08-17 ENCOUNTER — Ambulatory Visit (INDEPENDENT_AMBULATORY_CARE_PROVIDER_SITE_OTHER): Payer: 59

## 2019-08-17 DIAGNOSIS — M722 Plantar fascial fibromatosis: Secondary | ICD-10-CM | POA: Diagnosis not present

## 2019-08-17 DIAGNOSIS — M7672 Peroneal tendinitis, left leg: Secondary | ICD-10-CM

## 2019-08-17 MED ORDER — DICLOFENAC SODIUM 75 MG PO TBEC: 75.0000 mg | DELAYED_RELEASE_TABLET | Freq: Two times a day (BID) | ORAL | 2 refills | Status: DC

## 2019-08-17 NOTE — Patient Instructions (Signed)

## 2019-08-17 NOTE — Progress Notes (Signed)
Subjective:   Patient ID: Caitlin Henderson, female   DOB: 53 y.o.   MRN: [id number]   HPI Patient states the bottom of my left heel and the side has been really sore and moderately on the right.  Patient states that she was on concrete floors and it seemed to get worse after that and she does work in an environment where she is on her feet quite a bit.   ROS      Objective:  Physical Exam  Neurovascular status was found to be intact with patient found to have discomfort mostly in the perineal segment left with moderate discomfort in the plantar heel and mild discomfort right also noted.  Patient is also developing hip pain with changes in gait     Assessment:  Inflammatory fasciitis left over right with probable compensatory peroneal tendinitis left     Plan:  H&P reviewed all conditions and the chronic nature of her problem.  At this point I have recommended orthotics and patient is scanned for customized orthotics to reduce plantar stresses and I did dispense a night splint with all instructions on use and to try to stretch the arch and heel bilateral.  I did carefully injected the peroneal tendon complex left 3 mg X with some Kenalog in the plantar fascia 3 mg Kenalog 5 mg Xylocaine with both been tolerated well and she will be seen back when orthotics are ready to evaluate the response to conservative treatment.  Also placed diclofenac 75 mg twice daily  X-rays indicated small spur no indication for stress fracture arthritis

## 2019-09-19 ENCOUNTER — Ambulatory Visit: Payer: 59 | Admitting: Podiatry

## 2019-09-19 ENCOUNTER — Other Ambulatory Visit: Payer: 59 | Admitting: Orthotics

## 2019-09-19 ENCOUNTER — Other Ambulatory Visit: Payer: Self-pay

## 2019-09-19 ENCOUNTER — Encounter: Payer: Self-pay | Admitting: Podiatry

## 2019-09-19 DIAGNOSIS — M7672 Peroneal tendinitis, left leg: Secondary | ICD-10-CM | POA: Diagnosis not present

## 2019-09-19 DIAGNOSIS — M722 Plantar fascial fibromatosis: Secondary | ICD-10-CM

## 2019-09-20 NOTE — Progress Notes (Signed)
Subjective:   Patient ID: Caitlin Henderson, female   DOB: 53 y.o.   MRN: [id number]   HPI Patient presents stating overall my heels feel better but I do have some discomfort still in the outside the left foot but it is improved from previous   ROS      Objective:  Physical Exam  Neurovascular status intact with patient found to have mild discomfort on the plantar aspect left heel and still some discomfort on the lateral side of the left around the peroneal tendon.  Patient has good digital perfusion F2     Assessment:  Tendinitis still present but improved     Plan:  H&P reviewed condition and recommended the continuation of conservative care consisting of shoe gear modifications ice therapy and orthotics dispensed today with all instructions on usage.  Reappoint to recheck

## 2019-12-06 ENCOUNTER — Other Ambulatory Visit: Payer: Self-pay | Admitting: Family Medicine

## 2020-03-01 ENCOUNTER — Telehealth: Payer: Self-pay | Admitting: Family Medicine

## 2020-03-01 MED ORDER — LISINOPRIL 10 MG PO TABS: 10.0000 mg | ORAL_TABLET | Freq: Every day | ORAL | 0 refills | Status: DC

## 2020-03-01 NOTE — Telephone Encounter (Signed)
Patient needs refill on lisinopril  [facility]

## 2020-03-01 NOTE — Telephone Encounter (Signed)
Medication filled x1 with no refills.   Requires office visit before any further refills can be given.   Letter sent.  

## 2020-03-01 NOTE — Telephone Encounter (Signed)
Requires office visit before any further refills can be given.    Call placed to patient to schedule. [place].

## 2020-03-09 ENCOUNTER — Encounter: Payer: Self-pay | Admitting: Family Medicine

## 2020-03-09 ENCOUNTER — Ambulatory Visit: Payer: 59 | Admitting: Family Medicine

## 2020-03-09 ENCOUNTER — Other Ambulatory Visit: Payer: Self-pay

## 2020-03-09 VITALS — BP 126/70 | HR 78 | Temp 98.5°F | Resp 14 | Ht 63.0 in | Wt 205.0 lb

## 2020-03-09 DIAGNOSIS — R350 Frequency of micturition: Secondary | ICD-10-CM

## 2020-03-09 DIAGNOSIS — I10 Essential (primary) hypertension: Secondary | ICD-10-CM | POA: Diagnosis not present

## 2020-03-09 DIAGNOSIS — E669 Obesity, unspecified: Secondary | ICD-10-CM

## 2020-03-09 DIAGNOSIS — D229 Melanocytic nevi, unspecified: Secondary | ICD-10-CM

## 2020-03-09 DIAGNOSIS — L509 Urticaria, unspecified: Secondary | ICD-10-CM | POA: Diagnosis not present

## 2020-03-09 LAB — URINALYSIS, ROUTINE W REFLEX MICROSCOPIC
Bacteria, UA: NONE SEEN /HPF
Bilirubin Urine: NEGATIVE
Glucose, UA: NEGATIVE
Hyaline Cast: NONE SEEN /LPF
Ketones, ur: NEGATIVE
Leukocytes,Ua: NEGATIVE
Nitrite: NEGATIVE
Protein, ur: NEGATIVE
Specific Gravity, Urine: 1.025 (ref 1.001–1.03)
WBC, UA: NONE SEEN /HPF (ref 0–5)
pH: 6 (ref 5.0–8.0)

## 2020-03-09 LAB — MICROSCOPIC MESSAGE

## 2020-03-09 MED ORDER — AMLODIPINE BESYLATE 5 MG PO TABS: 5.0000 mg | ORAL_TABLET | Freq: Every day | ORAL | 3 refills | Status: DC

## 2020-03-09 NOTE — Progress Notes (Signed)
Subjective:    Patient ID: Caitlin Henderson, female    DOB: 13-May-1966, 54 y.o.   MRN: [id number]  Patient presents for Follow-up (is not fasting) and Itching (has generalized itching and hives- no known cause)   Pt has had intermittant episodes of itching  For almost a year with some hives, she will notice them all over the body, but no swelling of face, no wheezing or cough She does take benadryl  No change in soap detergent meds Even in a different environment from her home,still has itching. She was concerned it was due to lisinopril as this is the only routine med she takes   Stressors-  Her father passed away in In 12/17/2022  She had change in in job position but feels that stressors have settled down  HTN- taking lisinopril   She has not had menses in about 8 months with hot flashes   She does get low back pain on and off and has to urinate a lot especially at night She admits to drinking a lot of soda Concerned about DM Her weight is up 10lbs since last 12/17/22   She needs referral back to her dermatologist for her mole check   Review Of Systems:  GEN- denies fatigue, fever, weight loss,weakness, recent illness HEENT- denies eye drainage, change in vision, nasal discharge, CVS- denies chest pain, palpitations RESP- denies SOB, cough, wheeze ABD- denies N/V, change in stools, abd pain GU- denies dysuria, hematuria, dribbling, incontinence MSK- denies joint pain,+ muscle aches, injury Neuro- denies headache, dizziness, syncope, seizure activity       Objective:    BP 126/70   Pulse 78   Temp 98.5 F (36.9 C) (Temporal)   Resp 14   Ht 5\' 3"  (1.6 m)   Wt 205 lb (93 kg)   SpO2 98%   BMI 36.31 kg/m  GEN- NAD, alert and oriented x3 HEENT- PERRL, EOMI, non injected sclera, pink conjunctiva, MMM, oropharynx clear Neck- Supple, no thyromegaly CVS- RRR, no murmur RESP-CTAB ABD-NABS,soft,NT,ND MSK- Spine NT, fair ROM, neg SLR  NEURO- Strength in tact bilat LE  Skin- multiple  Nevi on trunk, no hives noted  EXT- No edema Pulses- Radial, DP- 2+        Assessment & Plan:      Problem List Items Addressed This Visit      Unprioritized   Class 2 obesity   Relevant Orders   Hemoglobin A1c (Completed)   Essential hypertension - Primary    Due to ? Lisinopril causing symptoms, will D/C Start norvasc 5mg  discussed SE of this medication as well  For the pruritis, start zyrtec 10mg  once a day  If no improvement with change in med, will send to allergy speciliast  For moles, referred to dermatology  For urinary frequency UA obtained, will also check A1C due to symptoms and increased weight      Relevant Medications   amLODipine (NORVASC) 5 MG tablet   Other Relevant Orders   CBC with Differential/Platelet (Completed)   Comprehensive metabolic panel (Completed)   Hemoglobin A1c (Completed)   Lipid panel (Completed)   TSH (Completed)    Other Visit Diagnoses    Urinary frequency       Relevant Orders   Urinalysis, Routine w reflex microscopic (Completed)   Hives       Relevant Orders   Comprehensive metabolic panel (Completed)   Ambulatory referral to Dermatology   Multiple nevi       Relevant Orders  Ambulatory referral to Dermatology      Note: This dictation was prepared with Dragon dictation along with smaller phrase technology. Any transcriptional errors that result from this process are unintentional.

## 2020-03-09 NOTE — Patient Instructions (Addendum)
Referral to Dermatology  ( Dr. [place] Crane )  Stop the lisinopril Start norvasc 5mg   Zyrtec once a day  F/u 4-5 MONTHS for Physical

## 2020-03-10 LAB — COMPREHENSIVE METABOLIC PANEL
AG Ratio: 2 (calc) (ref 1.0–2.5)
ALT: 16 U/L (ref 6–29)
AST: 17 U/L (ref 10–35)
Albumin: 4.5 g/dL (ref 3.6–5.1)
Alkaline phosphatase (APISO): 95 U/L (ref 37–153)
BUN: 12 mg/dL (ref 7–25)
CO2: 27 mmol/L (ref 20–32)
Calcium: 9.6 mg/dL (ref 8.6–10.4)
Chloride: 104 mmol/L (ref 98–110)
Creat: 0.93 mg/dL (ref 0.50–1.05)
Globulin: 2.2 g/dL (calc) (ref 1.9–3.7)
Glucose, Bld: 74 mg/dL (ref 65–99)
Potassium: 4.9 mmol/L (ref 3.5–5.3)
Sodium: 140 mmol/L (ref 135–146)
Total Bilirubin: 0.4 mg/dL (ref 0.2–1.2)
Total Protein: 6.7 g/dL (ref 6.1–8.1)

## 2020-03-10 LAB — CBC WITH DIFFERENTIAL/PLATELET
Absolute Monocytes: 792 cells/uL (ref 200–950)
Basophils Absolute: 64 cells/uL (ref 0–200)
Basophils Relative: 0.8 %
Eosinophils Absolute: 104 cells/uL (ref 15–500)
Eosinophils Relative: 1.3 %
HCT: 38.9 % (ref 35.0–45.0)
Hemoglobin: 13.1 g/dL (ref 11.7–15.5)
Lymphs Abs: 2232 cells/uL (ref 850–3900)
MCH: 30.2 pg (ref 27.0–33.0)
MCHC: 33.7 g/dL (ref 32.0–36.0)
MCV: 89.6 fL (ref 80.0–100.0)
MPV: 10.9 fL (ref 7.5–12.5)
Monocytes Relative: 9.9 %
Neutro Abs: 4808 cells/uL (ref 1500–7800)
Neutrophils Relative %: 60.1 %
Platelets: 333 10*3/uL (ref 140–400)
RBC: 4.34 10*6/uL (ref 3.80–5.10)
RDW: 12.7 % (ref 11.0–15.0)
Total Lymphocyte: 27.9 %
WBC: 8 10*3/uL (ref 3.8–10.8)

## 2020-03-10 LAB — LIPID PANEL
Cholesterol: 236 mg/dL — ABNORMAL HIGH (ref <200)
HDL: 38 mg/dL — ABNORMAL LOW (ref >=50)
LDL Cholesterol (Calc): 160 mg/dL (calc) — ABNORMAL HIGH
Non-HDL Cholesterol (Calc): 198 mg/dL (calc) — ABNORMAL HIGH (ref <130)
Total CHOL/HDL Ratio: 6.2 (calc) — ABNORMAL HIGH (ref <5.0)
Triglycerides: 214 mg/dL — ABNORMAL HIGH (ref <150)

## 2020-03-10 LAB — HEMOGLOBIN A1C
Hgb A1c MFr Bld: 5 % of total Hgb (ref <5.7)
Mean Plasma Glucose: 97 (calc)
eAG (mmol/L): 5.4 (calc)

## 2020-03-10 LAB — TSH: TSH: 3.62 mIU/L

## 2020-03-11 ENCOUNTER — Encounter: Payer: Self-pay | Admitting: Family Medicine

## 2020-03-11 NOTE — Assessment & Plan Note (Signed)
Due to ? Lisinopril causing symptoms, will D/C Start norvasc 5mg  discussed SE of this medication as well  For the pruritis, start zyrtec 10mg  once a day  If no improvement with change in med, will send to allergy speciliast  For moles, referred to dermatology  For urinary frequency UA obtained, will also check A1C due to symptoms and increased weight

## 2020-03-13 ENCOUNTER — Other Ambulatory Visit: Payer: Self-pay | Admitting: *Deleted

## 2020-03-13 DIAGNOSIS — I10 Essential (primary) hypertension: Secondary | ICD-10-CM

## 2020-03-13 DIAGNOSIS — E7849 Other hyperlipidemia: Secondary | ICD-10-CM

## 2020-03-13 MED ORDER — ATORVASTATIN CALCIUM 10 MG PO TABS
10.0000 mg | ORAL_TABLET | Freq: Every day | ORAL | 3 refills | Status: DC
Start: 2020-03-13 — End: 2021-03-11

## 2020-04-04 ENCOUNTER — Telehealth: Payer: Self-pay | Admitting: *Deleted

## 2020-04-04 DIAGNOSIS — L299 Pruritus, unspecified: Secondary | ICD-10-CM

## 2020-04-04 DIAGNOSIS — L509 Urticaria, unspecified: Secondary | ICD-10-CM

## 2020-04-04 NOTE — Telephone Encounter (Signed)
Received call from patient.   Reports that Lisionpril was stopped D/T possible itching. States that she was started on Amlodipine in its place.   Reports that itching has not resolved. States that she would like to resume Lisinopril.   MD please advise.

## 2020-04-04 NOTE — Telephone Encounter (Signed)
She can resume lisinopril D/C norvasc  Next step was referral to allergy DxChronic puritis  Continue zyrtec , she can also add pepcid 20mg  BID for itching

## 2020-04-05 MED ORDER — LISINOPRIL 10 MG PO TABS: 10.0000 mg | ORAL_TABLET | Freq: Every day | ORAL | Status: DC

## 2020-04-05 MED ORDER — LISINOPRIL 10 MG PO TABS: 10.0000 mg | ORAL_TABLET | Freq: Every day | ORAL | 3 refills | Status: DC

## 2020-04-05 NOTE — Telephone Encounter (Signed)
Call placed to patient and patient made aware.   Agreeable to plan.   Prescription sent to pharmacy.

## 2020-04-05 NOTE — Addendum Note (Signed)
Addended by: Sheral Flow on: 04/05/2020 10:13 AM   Modules accepted: Orders

## 2020-04-05 NOTE — Telephone Encounter (Signed)
Call placed to patient. LMTRC.  

## 2020-06-19 ENCOUNTER — Other Ambulatory Visit: Payer: Self-pay

## 2020-06-19 ENCOUNTER — Other Ambulatory Visit: Payer: 59

## 2020-06-20 ENCOUNTER — Ambulatory Visit (INDEPENDENT_AMBULATORY_CARE_PROVIDER_SITE_OTHER): Payer: 59 | Admitting: Family Medicine

## 2020-06-20 ENCOUNTER — Encounter: Payer: Self-pay | Admitting: Family Medicine

## 2020-06-20 VITALS — BP 102/64 | HR 100 | Temp 98.2°F | Resp 14 | Ht 63.0 in | Wt 208.0 lb

## 2020-06-20 DIAGNOSIS — E669 Obesity, unspecified: Secondary | ICD-10-CM

## 2020-06-20 DIAGNOSIS — M545 Low back pain, unspecified: Secondary | ICD-10-CM

## 2020-06-20 DIAGNOSIS — Z0001 Encounter for general adult medical examination with abnormal findings: Secondary | ICD-10-CM | POA: Diagnosis not present

## 2020-06-20 DIAGNOSIS — I10 Essential (primary) hypertension: Secondary | ICD-10-CM | POA: Diagnosis not present

## 2020-06-20 DIAGNOSIS — Z0000 Encounter for general adult medical examination without abnormal findings: Secondary | ICD-10-CM

## 2020-06-20 DIAGNOSIS — E7800 Pure hypercholesterolemia, unspecified: Secondary | ICD-10-CM

## 2020-06-20 DIAGNOSIS — E785 Hyperlipidemia, unspecified: Secondary | ICD-10-CM | POA: Insufficient documentation

## 2020-06-20 NOTE — Assessment & Plan Note (Signed)
Return for fasting labs.

## 2020-06-20 NOTE — Assessment & Plan Note (Signed)
Discussed low carb diet Increasing protein Reducing SODA Tracking on [employer]

## 2020-06-20 NOTE — Progress Notes (Signed)
   Subjective:    Patient ID: Caitlin Henderson, female    DOB: [birth year], 54 y.o.   MRN: [id number]  Patient presents for Annual Exam (is not fasting) Patient here for annual wellness visit.  She is followed by Dr. Philis Pique OB/GYN.  Pap smear and mammogram are up-to-date.  Colon cancer screening due.   immunizations discussed shingles vaccine.  She declines COVID-19 vaccine.  Tetanus is up-to-date  Hypertension she is taking her lisinopril as prescribed no side effects with the medication.  Lipidemia she is now on Lipitor this was started 3 months ago she is due for repeat lipid panel  Obesity she states that she will start working on her diet.  She eats too many carbs.  But does not eat very large portions.  She typically has felt needed cookies and coffee in the morning chicken sandwich at lunch and sometimes needs with carbs for dinner.  She will then snack in the evening.  She is also quite sedentary     Review Of Systems:  GEN- denies fatigue, fever, weight loss,weakness, recent illness HEENT- denies eye drainage, change in vision, nasal discharge, CVS- denies chest pain, palpitations RESP- denies SOB, cough, wheeze ABD- denies N/V, change in stools, abd pain GU- denies dysuria, hematuria, dribbling, incontinence MSK- denies joint pain, muscle aches, injury Neuro- denies headache, dizziness, syncope, seizure activity       Objective:    BP 102/64   Pulse 100   Temp 98.2 F (36.8 C) (Temporal)   Resp 14   Ht 5\' 3"  (1.6 m)   Wt 208 lb (94.3 kg)   SpO2 97%   BMI 36.85 kg/m  GEN- NAD, alert and oriented x3,obese  HEENT- PERRL, EOMI, non injected sclera, pink conjunctiva, MMM, oropharynx clear, TM clear no effusion Neck- Supple, no thyromegaly CVS- RRR, no murmur RESP-CTAB ABD-NABS,soft,NT,ND MSK- TTP Right parspinals, fair ROM spine, neg SLR NEUROStrength in tact LE, sensation grossly in tact LE  PSYCH- normal affect and mood EXT- No edema Pulses- Radial, DP-  2+  FALL/AUDIT c/Depression screen neg       Assessment & Plan:      Problem List Items Addressed This Visit      Unprioritized   Class 2 obesity    Discussed low carb diet Increasing protein Reducing SODA Tracking on myfitness PAL      Relevant Orders   Hemoglobin A1c   Comprehensive metabolic panel   Essential hypertension    Controlled no changes       Relevant Orders   CBC with Differential/Platelet   Hemoglobin A1c   Hyperlipidemia    Return for fasting labs       Relevant Orders   Lipid panel    Other Visit Diagnoses    Routine general medical examination at a health care facility    -  Primary   CPE done, cologuard for colon cancer screening to be done ,, obtain records from  shingles vaccine at pharmacy    Relevant Orders   Hepatitis C antibody   CBC with Differential/Platelet   Hemoglobin A1c   Lumbar back pain       obtain xray lumbar spine, chronic back pain, discussed topical pain relief patches, diclofenac/tylenol    Relevant Orders   DG Lumbar Spine Complete      Note: This dictation was prepared with Dragon dictation along with smaller phrase technology. Any transcriptional errors that result from this process are unintentional.

## 2020-06-20 NOTE — Patient Instructions (Addendum)
MyFitnessPAL 1500 calories  F/U for fasting labs Get xray done at [facility]  F/U 6 months

## 2020-06-20 NOTE — Assessment & Plan Note (Signed)
Controlled no changes 

## 2020-06-21 ENCOUNTER — Telehealth: Payer: Self-pay | Admitting: *Deleted

## 2020-06-21 MED ORDER — SHINGRIX 50 MCG/0.5ML IM SUSR: 0.5000 mL | Freq: Once | INTRAMUSCULAR | 1 refills | Status: AC

## 2020-06-21 NOTE — Telephone Encounter (Signed)
Received verbal orders for Cologuard.   Order placed via [employer].   Cologuard (Order [id number])

## 2020-06-21 NOTE — Telephone Encounter (Signed)
-----   Message from Alycia Rossetti, MD sent at 06/20/2020  9:14 PM EDT ----- Regarding: send shingrix, cologuard, need PAP  and Mammo Dr. Philis Pique

## 2020-07-05 LAB — COLOGUARD: Cologuard: NEGATIVE

## 2020-07-13 LAB — EXTERNAL GENERIC LAB PROCEDURE: COLOGUARD: NEGATIVE

## 2020-07-13 LAB — COLOGUARD: COLOGUARD: NEGATIVE

## 2020-07-13 NOTE — Telephone Encounter (Signed)
Received the results of Cologuard screening.   Screening noted negative.   A negative result indicates a low likelihood of colorectal cancer is present. Following a negative Cologuard result, the American Cancer Society recommends a Cologuard re-screening interval of 3 years.   Letter sent.   

## 2020-08-02 ENCOUNTER — Other Ambulatory Visit: Payer: Self-pay

## 2020-08-02 DIAGNOSIS — Z20822 Contact with and (suspected) exposure to covid-19: Secondary | ICD-10-CM

## 2020-08-03 LAB — NOVEL CORONAVIRUS, NAA: SARS-CoV-2, NAA: NOT DETECTED

## 2020-08-03 LAB — SARS-COV-2, NAA 2 DAY TAT

## 2021-03-11 ENCOUNTER — Other Ambulatory Visit: Payer: Self-pay | Admitting: Family Medicine

## 2021-03-21 ENCOUNTER — Other Ambulatory Visit: Payer: Self-pay | Admitting: Family Medicine

## 2021-05-16 ENCOUNTER — Ambulatory Visit: Payer: 59 | Admitting: Family Medicine

## 2021-05-16 ENCOUNTER — Encounter: Payer: Self-pay | Admitting: Family Medicine

## 2021-05-16 ENCOUNTER — Other Ambulatory Visit: Payer: Self-pay

## 2021-05-16 VITALS — BP 110/66 | HR 112 | Temp 98.1°F | Resp 14 | Ht 63.0 in | Wt 217.0 lb

## 2021-05-16 DIAGNOSIS — J069 Acute upper respiratory infection, unspecified: Secondary | ICD-10-CM

## 2021-05-16 MED ORDER — NIRMATRELVIR/RITONAVIR (PAXLOVID)TABLET: 3.0000 | ORAL_TABLET | Freq: Two times a day (BID) | ORAL | 0 refills | Status: AC

## 2021-05-16 MED ORDER — FLUCONAZOLE 150 MG PO TABS: 150.0000 mg | ORAL_TABLET | Freq: Once | ORAL | 0 refills | Status: AC

## 2021-05-16 MED ORDER — LISINOPRIL 10 MG PO TABS
ORAL_TABLET | ORAL | 0 refills | Status: DC
Start: 2021-05-16 — End: 2021-09-30

## 2021-05-16 MED ORDER — AMOXICILLIN 875 MG PO TABS: 875.0000 mg | ORAL_TABLET | Freq: Two times a day (BID) | ORAL | 0 refills | Status: AC

## 2021-05-16 MED ORDER — ATORVASTATIN CALCIUM 10 MG PO TABS
ORAL_TABLET | ORAL | 0 refills | Status: DC
Start: 2021-05-16 — End: 2021-09-10

## 2021-05-16 NOTE — Progress Notes (Signed)
Subjective:    Patient ID: Caitlin Henderson, female    DOB: Apr 01, 1966, 55 y.o.   MRN: [id number]  HPI 5 days ago, the patient's son tested positive for COVID.  He has runny nose, head congestion, cough, sore throat, and stomach upset.  She developed the same symptoms at the same time.  She tested negative.  They live in the same household and are around each other all the time.  She is now also complaining of some pain in her right upper teeth.  On examination there is some erythema at the base of the second molar on the upper right side.  She is scheduled to have a root canal but she is concerned that that tooth may have become infected however she continues to have head congestion, sinus pressure, sinus pain, subjective fevers, and cough Past Medical History:  Diagnosis Date   Frequent UTI    HLD (hyperlipidemia)    Hypertension    Past Surgical History:  Procedure Laterality Date   HERNIA REPAIR     Current Outpatient Medications on File Prior to Visit  Medication Sig Dispense Refill   atorvastatin (LIPITOR) 10 MG tablet TAKE ONE TABLET (10MG  TOTAL) BY MOUTH DAILY 90 tablet 0   diclofenac (VOLTAREN) 75 MG EC tablet Take 1 tablet (75 mg total) by mouth 2 (two) times daily. (Patient taking differently: Take 75 mg by mouth 2 (two) times daily. PRN) 50 tablet 2   lisinopril (ZESTRIL) 10 MG tablet TAKE ONE TABLET (10MG  TOTAL) BY MOUTH DAILY 90 tablet 0   No current facility-administered medications on file prior to visit.   Allergies  Allergen Reactions   Bee Venom    Social History   Socioeconomic History   Marital status: Married    Spouse name: Not on file   Number of children: Not on file   Years of education: Not on file   Highest education level: Not on file  Occupational History   Not on file  Tobacco Use   Smoking status: Never   Smokeless tobacco: Never  Substance and Sexual Activity   Alcohol use: No    Alcohol/week: 0.0 standard drinks   Drug use: No   Sexual  activity: Not on file  Other Topics Concern   Not on file  Social History Narrative   Not on file   Social Determinants of Health   Financial Resource Strain: Not on file  Food Insecurity: Not on file  Transportation Needs: Not on file  Physical Activity: Not on file  Stress: Not on file  Social Connections: Not on file  Intimate Partner Violence: Not on file     Review of Systems  All other systems reviewed and are negative.     Objective:   Physical Exam Vitals reviewed.  Constitutional:      General: She is not in acute distress.    Appearance: Normal appearance. She is not ill-appearing, toxic-appearing or diaphoretic.  HENT:     Right Ear: Tympanic membrane and ear canal normal.     Left Ear: Tympanic membrane and ear canal normal.     Nose: Congestion and rhinorrhea present.     Right Turbinates: Swollen.     Left Turbinates: Swollen.     Right Sinus: Maxillary sinus tenderness and frontal sinus tenderness present.     Mouth/Throat:     Mouth: Mucous membranes are moist.     Dentition: Dental tenderness and gingival swelling present.     Pharynx: Oropharynx is clear.  No oropharyngeal exudate or posterior oropharyngeal erythema.   Eyes:     Conjunctiva/sclera: Conjunctivae normal.  Cardiovascular:     Rate and Rhythm: Normal rate and regular rhythm.     Heart sounds: Normal heart sounds. No murmur heard.   No friction rub. No gallop.  Pulmonary:     Effort: Pulmonary effort is normal. No respiratory distress.     Breath sounds: Normal breath sounds. No stridor. No wheezing, rhonchi or rales.  Chest:     Chest wall: No tenderness.  Abdominal:     General: Abdomen is flat. Bowel sounds are normal. There is no distension.     Palpations: Abdomen is soft.     Tenderness: There is no abdominal tenderness.  Musculoskeletal:     Cervical back: No rigidity.  Lymphadenopathy:     Cervical: No cervical adenopathy.  Neurological:     Mental Status: She is alert.           Assessment & Plan:  Viral upper respiratory tract infection - Plan: SARS-COV-2 RNA,(COVID-19) QUAL NAAT I believe the patient has COVID just based on her exposure and her symptoms.  Begin Paxil COVID 3 tablets twice daily for 5 days.  Also believe that she may be developing an abscessed tooth based on the erythema and swelling.  Begin amoxicillin 875 mg twice daily for 10 days.  I sent a COVID test.  Recommend quarantine until COVID test is back.

## 2021-05-17 LAB — SARS-COV-2 RNA,(COVID-19) QUALITATIVE NAAT: SARS CoV2 RNA: NOT DETECTED

## 2021-09-10 ENCOUNTER — Other Ambulatory Visit: Payer: Self-pay | Admitting: Family Medicine

## 2021-09-30 ENCOUNTER — Other Ambulatory Visit: Payer: Self-pay | Admitting: Family Medicine

## 2021-09-30 MED ORDER — LISINOPRIL 10 MG PO TABS: ORAL_TABLET | ORAL | 2 refills | Status: DC

## 2021-09-30 NOTE — Addendum Note (Signed)
Addended by: Wadie Lessen on: 09/30/2021 05:30 PM   Modules accepted: Orders

## 2021-11-26 ENCOUNTER — Other Ambulatory Visit: Payer: Self-pay

## 2021-11-26 ENCOUNTER — Encounter: Payer: Self-pay | Admitting: Family Medicine

## 2021-11-26 ENCOUNTER — Ambulatory Visit (INDEPENDENT_AMBULATORY_CARE_PROVIDER_SITE_OTHER): Payer: 59 | Admitting: Family Medicine

## 2021-11-26 VITALS — BP 118/62 | HR 92 | Temp 97.8°F | Resp 18 | Ht 63.0 in | Wt 212.0 lb

## 2021-11-26 DIAGNOSIS — Z0001 Encounter for general adult medical examination with abnormal findings: Secondary | ICD-10-CM

## 2021-11-26 DIAGNOSIS — I10 Essential (primary) hypertension: Secondary | ICD-10-CM | POA: Diagnosis not present

## 2021-11-26 DIAGNOSIS — E669 Obesity, unspecified: Secondary | ICD-10-CM | POA: Diagnosis not present

## 2021-11-26 DIAGNOSIS — Z1211 Encounter for screening for malignant neoplasm of colon: Secondary | ICD-10-CM | POA: Diagnosis not present

## 2021-11-26 DIAGNOSIS — Z0000 Encounter for general adult medical examination without abnormal findings: Secondary | ICD-10-CM

## 2021-11-26 LAB — CBC WITH DIFFERENTIAL/PLATELET
Absolute Monocytes: 466 cells/uL (ref 200–950)
Basophils Absolute: 57 cells/uL (ref 0–200)
Basophils Relative: 0.9 %
Eosinophils Absolute: 88 cells/uL (ref 15–500)
Eosinophils Relative: 1.4 %
HCT: 39.8 % (ref 35.0–45.0)
Hemoglobin: 13.2 g/dL (ref 11.7–15.5)
Lymphs Abs: 1915 cells/uL (ref 850–3900)
MCH: 29.6 pg (ref 27.0–33.0)
MCHC: 33.2 g/dL (ref 32.0–36.0)
MCV: 89.2 fL (ref 80.0–100.0)
MPV: 11.3 fL (ref 7.5–12.5)
Monocytes Relative: 7.4 %
Neutro Abs: 3774 cells/uL (ref 1500–7800)
Neutrophils Relative %: 59.9 %
Platelets: 299 10*3/uL (ref 140–400)
RBC: 4.46 10*6/uL (ref 3.80–5.10)
RDW: 13.1 % (ref 11.0–15.0)
Total Lymphocyte: 30.4 %
WBC: 6.3 10*3/uL (ref 3.8–10.8)

## 2021-11-26 LAB — LIPID PANEL
Cholesterol: 158 mg/dL (ref <200)
HDL: 41 mg/dL — ABNORMAL LOW (ref >=50)
LDL Cholesterol (Calc): 96 mg/dL (calc)
Non-HDL Cholesterol (Calc): 117 mg/dL (calc) (ref <130)
Total CHOL/HDL Ratio: 3.9 (calc) (ref <5.0)
Triglycerides: 109 mg/dL (ref <150)

## 2021-11-26 LAB — COMPLETE METABOLIC PANEL WITH GFR
AG Ratio: 1.8 (calc) (ref 1.0–2.5)
ALT: 17 U/L (ref 6–29)
AST: 16 U/L (ref 10–35)
Albumin: 4.5 g/dL (ref 3.6–5.1)
Alkaline phosphatase (APISO): 103 U/L (ref 37–153)
BUN: 11 mg/dL (ref 7–25)
CO2: 27 mmol/L (ref 20–32)
Calcium: 9.8 mg/dL (ref 8.6–10.4)
Chloride: 107 mmol/L (ref 98–110)
Creat: 0.91 mg/dL (ref 0.50–1.03)
Globulin: 2.5 g/dL (calc) (ref 1.9–3.7)
Glucose, Bld: 90 mg/dL (ref 65–99)
Potassium: 4.3 mmol/L (ref 3.5–5.3)
Sodium: 142 mmol/L (ref 135–146)
Total Bilirubin: 0.6 mg/dL (ref 0.2–1.2)
Total Protein: 7 g/dL (ref 6.1–8.1)
eGFR: 75 mL/min/{1.73_m2} (ref >=60)

## 2021-11-26 NOTE — Progress Notes (Signed)
Subjective:    Patient ID: Caitlin Henderson, female    DOB: [birth year], 56 y.o.   MRN: [id number]  HPI Patient is here today for complete physical exam.  She is due for mammogram as well as a Pap smear but she prefers to schedule this with her gynecologist.  She is also due for colonoscopy and she will allow me to schedule this.  She is due for a shingles vaccine but she declines all immunizations including the flu shot and the COVID-vaccine.  She is interested in weight loss.  She has hypertension.  Her BMI is 37.5.  Therefore she would qualify.  We discussed Ozempic at 47.  She does not have any history of diabetes to her knowledge. Past Medical History:  Diagnosis Date   Frequent UTI    HLD (hyperlipidemia)    Hypertension    Past Surgical History:  Procedure Laterality Date   HERNIA REPAIR     Current Outpatient Medications on File Prior to Visit  Medication Sig Dispense Refill   atorvastatin (LIPITOR) 10 MG tablet TAKE ONE TABLET (10MG  TOTAL) BY MOUTH DAILY 90 tablet 0   diclofenac (VOLTAREN) 75 MG EC tablet Take 1 tablet (75 mg total) by mouth 2 (two) times daily. (Patient taking differently: Take 75 mg by mouth 2 (two) times daily. PRN) 50 tablet 2   lisinopril (ZESTRIL) 10 MG tablet TAKE ONE TABLET (10MG  TOTAL) BY MOUTH DAILY 90 tablet 2   No current facility-administered medications on file prior to visit.   Allergies  Allergen Reactions   Bee Venom    Social History   Socioeconomic History   Marital status: Married    Spouse name: Not on file   Number of children: Not on file   Years of education: Not on file   Highest education level: Not on file  Occupational History   Not on file  Tobacco Use   Smoking status: Never   Smokeless tobacco: Never  Substance and Sexual Activity   Alcohol use: No    Alcohol/week: 0.0 standard drinks   Drug use: No   Sexual activity: Not on file  Other Topics Concern   Not on file  Social History Narrative   Not  on file   Social Determinants of Health   Financial Resource Strain: Not on file  Food Insecurity: Not on file  Transportation Needs: Not on file  Physical Activity: Not on file  Stress: Not on file  Social Connections: Not on file  Intimate Partner Violence: Not on file     Review of Systems  All other systems reviewed and are negative.     Objective:   Physical Exam Vitals reviewed.  Constitutional:      General: She is not in acute distress.    Appearance: Normal appearance. She is obese. She is not ill-appearing, toxic-appearing or diaphoretic.  HENT:     Head: Normocephalic and atraumatic.     Right Ear: Tympanic membrane and ear canal normal.     Left Ear: Tympanic membrane and ear canal normal.     Nose: No congestion or rhinorrhea.     Mouth/Throat:     Mouth: Mucous membranes are moist.     Pharynx: Oropharynx is clear. No oropharyngeal exudate or posterior oropharyngeal erythema.  Eyes:     General:        Right eye: No discharge.        Left eye: No discharge.     Conjunctiva/sclera: Conjunctivae normal.  Neck:  Vascular: No carotid bruit.  Cardiovascular:     Rate and Rhythm: Normal rate and regular rhythm.     Heart sounds: Normal heart sounds. No murmur heard.   No friction rub. No gallop.  Pulmonary:     Effort: Pulmonary effort is normal. No respiratory distress.     Breath sounds: Normal breath sounds. No stridor. No wheezing, rhonchi or rales.  Chest:     Chest wall: No tenderness.  Abdominal:     General: Abdomen is flat. Bowel sounds are normal. There is no distension.     Palpations: Abdomen is soft.     Tenderness: There is no abdominal tenderness. There is no guarding or rebound.     Hernia: No hernia is present.  Musculoskeletal:     Cervical back: Neck supple. No rigidity or tenderness.     Right lower leg: No edema.     Left lower leg: No edema.  Lymphadenopathy:     Cervical: No cervical adenopathy.  Skin:    Coloration: Skin  is not jaundiced.     Findings: No bruising, lesion or rash.  Neurological:     General: No focal deficit present.     Mental Status: She is alert and oriented to person, place, and time.     Cranial Nerves: No cranial nerve deficit.     Sensory: No sensory deficit.     Motor: No weakness.     Coordination: Coordination normal.     Gait: Gait normal.     Deep Tendon Reflexes: Reflexes normal.  Psychiatric:        Mood and Affect: Mood normal.        Behavior: Behavior normal.        Thought Content: Thought content normal.        Judgment: Judgment normal.          Assessment & Plan:  Colon cancer screening - Plan: Ambulatory referral to Gastroenterology  Benign essential HTN - Plan: CBC with Differential/Platelet, Lipid panel, COMPLETE METABOLIC PANEL WITH GFR  Routine general medical examination at a health care facility  Class 2 obesity Recommended a mammogram.  I recommended a Pap smear.  She like to schedule this with her gynecologist.  I will schedule her for colonoscopy.  I recommended the shingles vaccine.  We also discussed the flu shot and COVID-vaccine but she declines these.  I will check a CBC, CMP, and lipid panel.  Blood pressure today is acceptable at 118/60..  I would like to start the patient on a GLP-1 agonist to help facilitate weight loss.  Patient will check with her insurance to see if this is covered.

## 2021-11-27 ENCOUNTER — Encounter (INDEPENDENT_AMBULATORY_CARE_PROVIDER_SITE_OTHER): Payer: Self-pay | Admitting: *Deleted

## 2021-12-09 ENCOUNTER — Other Ambulatory Visit: Payer: Self-pay | Admitting: Family Medicine

## 2021-12-16 ENCOUNTER — Telehealth: Payer: Self-pay

## 2021-12-16 NOTE — Telephone Encounter (Signed)
Patient called to report she has spoken with her insurance company and they will cover [place]. Patient would like to start this medication.  Please advise, thanks!

## 2021-12-17 ENCOUNTER — Other Ambulatory Visit: Payer: Self-pay | Admitting: Family Medicine

## 2021-12-17 MED ORDER — OZEMPIC (0.25 OR 0.5 MG/DOSE) 2 MG/1.5ML ~~LOC~~ SOPN: 0.5000 mg | PEN_INJECTOR | SUBCUTANEOUS | 2 refills | Status: DC

## 2022-03-04 ENCOUNTER — Telehealth: Payer: Self-pay | Admitting: Family Medicine

## 2022-03-04 MED ORDER — OZEMPIC (0.25 OR 0.5 MG/DOSE) 2 MG/1.5ML ~~LOC~~ SOPN: 0.5000 mg | PEN_INJECTOR | SUBCUTANEOUS | 2 refills | Status: DC

## 2022-03-04 NOTE — Telephone Encounter (Signed)
Received fax from [facility] for a prescription on ozempic they are asking for a new RX for new 3 ml size (1.5ML no longer made) ?

## 2022-03-04 NOTE — Telephone Encounter (Signed)
Requested medication (s) are due for refill today -yes ? ?Requested medication (s) are on the active medication list -yes ? ?Future visit scheduled -no ? ?Last refill: 12/17/21 1.78m 2RF ? ?Notes to clinic: Pharmacy request changes to Rx: [facility] for a new RX for new 3 ml size (1.5ML no longer made) Sent for provider review  ? ?Requested Prescriptions  ?Pending Prescriptions Disp Refills  ? Semaglutide,0.25 or 0.'5MG'$ /DOS, (OZEMPIC, 0.25 OR 0.5 MG/DOSE,) 2 MG/1.5ML SOPN 1.5 mL 2  ?  Sig: Inject 0.5 mg into the skin once a week. Need to increase dose in 1 month if tolerated  ?  ? Endocrinology:  Diabetes - GLP-1 Receptor Agonists - semaglutide Failed - 03/04/2022  8:48 AM  ?  ?  Failed - HBA1C in normal range and within 180 days  ?  Hgb A1c MFr Bld  ?Date Value Ref Range Status  ?03/09/2020 5.0 <5.7 % of total Hgb Final  ?  Comment:  ?  For the purpose of screening for the presence of ?diabetes: ?. ?<5.7%       Consistent with the absence of diabetes ?5.7-6.4%    Consistent with increased risk for diabetes ?            (prediabetes) ?> or =6.5%  Consistent with diabetes ?.Marland Kitchen?This assay result is consistent with a decreased risk ?of diabetes. ?. ?Currently, no consensus exists regarding use of ?hemoglobin A1c for diagnosis of diabetes in children. ?. ?According to American Diabetes Association (ADA) ?guidelines, hemoglobin A1c <7.0% represents optimal ?control in non-pregnant diabetic patients. Different ?metrics may apply to specific patient populations.  ?Standards of Medical Care in Diabetes(ADA). ?. ?  ?  ?  ?  ?  Failed - Valid encounter within last 6 months  ?  Recent Outpatient Visits   ? ?      ? 3 months ago [facility]ie [facility]  ? 9 months ago [facility] respiratory tract [facility]  [facility]  ? 1 year ago Routine general medical examination  at a health care facility  [facility] [place] [facility]  ? 1 year ago Essential hypertension  ? [place]un[place]  ? 3 years ago Acute rhinosinusitis  ? [place] Nunnery MD  ? ?  ?  ? ? ?  ?  ?  Passed - Cr in normal range and within 360 days  ?  Creat  ?Date Value Ref Range Status  ?11/26/2021 0.91 0.50 - 1.03 mg/dL Final  ?  ?  ?  ?  ? ? ? ?Requested Prescriptions  ?Pending Prescriptions Disp Refills  ? Semaglutide,0.25 or 0.'5MG'$ /DOS, (OZEMPIC, 0.25 OR 0.5 MG/DOSE,) 2 MG/1.5ML SOPN 1.5 mL 2  ?  Sig: Inject 0.5 mg into the skin once a week. Need to increase dose in 1 month if tolerated  ?  ? Endocrinology:  Diabetes - GLP-1 Receptor Agonists - semaglutide Failed - 03/04/2022  8:48 AM  ?  ?  Failed - HBA1C in normal range and within 180 days  ?  Hgb A1c MFr Bld  ?Date Value Ref Range Status  ?03/09/2020 5.0 <5.7 % of total Hgb Final  ?  Comment:  ?  For the purpose of screening for the presence of ?diabetes: ?. ?<5.7%  Consistent with the absence of diabetes ?5.7-6.4%    Consistent with increased risk for diabetes ?            (prediabetes) ?> or =6.5%  Consistent with diabetes ?Marland Kitchen ?This assay result is consistent with a decreased risk ?of diabetes. ?. ?Currently, no consensus exists regarding use of ?hemoglobin A1c for diagnosis of diabetes in children. ?. ?According to American Diabetes Association (ADA) ?guidelines, hemoglobin A1c <7.0% represents optimal ?control in non-pregnant diabetic patients. Different ?metrics may apply to specific patient populations.  ?Standards of Medical Care in Diabetes(ADA). ?. ?  ?  ?  ?  ?  Failed - Valid encounter within last 6 months  ?  Recent Outpatient Visits   ? ?      ? 3 months ago Colon cancer screening  ? [facility] [place], Cammie Mcgee, MD  ? 9 months ago Viral upper respiratory tract infection  ? [facility] [place], Cammie Mcgee, MD  ? 1 year ago Routine  general medical examination at a health care facility  ? [facility] [place], Modena Nunnery, MD  ? 1 year ago Essential hypertension  ? [place], Modena Nunnery, MD  ? 3 years ago Acute rhinosinusitis  ? [place], Modena Nunnery, MD  ? ?  ?  ? ? ?  ?  ?  Passed - Cr in normal range and within 360 days  ?  Creat  ?Date Value Ref Range Status  ?11/26/2021 0.91 0.50 - 1.03 mg/dL Final  ?  ?  ?  ?  ? ? ? ?

## 2022-03-06 ENCOUNTER — Other Ambulatory Visit: Payer: Self-pay

## 2022-03-06 ENCOUNTER — Other Ambulatory Visit: Payer: Self-pay | Admitting: Family Medicine

## 2022-03-06 MED ORDER — OZEMPIC (0.25 OR 0.5 MG/DOSE) 2 MG/1.5ML ~~LOC~~ SOPN: 0.5000 mg | PEN_INJECTOR | SUBCUTANEOUS | 0 refills | Status: DC

## 2022-03-06 NOTE — Telephone Encounter (Signed)
Received a 2nd request for the new 54m size. ?

## 2022-03-06 NOTE — Progress Notes (Signed)
New prescription

## 2022-03-07 NOTE — Telephone Encounter (Signed)
Patient had an OV 11/26/21, protocol states that pt had not had an OV within the last 12 months. Will refill medication. ? ?Requested Prescriptions  ?Pending Prescriptions Disp Refills  ?? atorvastatin (LIPITOR) 10 MG tablet [Pharmacy Med Name: ATORVASTATIN CALCIUM 10 MG TAB] 90 tablet 0  ?  Sig: TAKE ONE TABLET ('10MG'$  TOTAL) BY MOUTH DAILY  ?  ? Cardiovascular:  Antilipid - Statins Failed - 03/06/2022  6:06 PM  ?  ?  Failed - Valid encounter within last 12 months  ?  Recent Outpatient Visits   ?      ? 3 months ago Colon cancer screening  ? [facility] [place], Cammie Mcgee, MD  ? 9 months ago Viral upper respiratory tract infection  ? [facility] [place], Cammie Mcgee, MD  ? 1 year ago Routine general medical examination at a health care facility  ? [facility] [place], Modena Nunnery, MD  ? 1 year ago Essential hypertension  ? [place], Modena Nunnery, MD  ? 3 years ago Acute rhinosinusitis  ? [facility] [place], Modena Nunnery, MD  ?  ?  ? ?  ?  ?  Failed - Lipid Panel in normal range within the last 12 months  ?  Cholesterol  ?Date Value Ref Range Status  ?11/26/2021 158 <200 mg/dL Final  ? ?LDL Cholesterol (Calc)  ?Date Value Ref Range Status  ?11/26/2021 96 mg/dL (calc) Final  ?  Comment:  ?  Reference range: <100 ?Marland Kitchen ?Desirable range <100 mg/dL for primary prevention;   ?<70 mg/dL for patients with CHD or diabetic patients  ?with > or = 2 CHD risk factors. ?. ?LDL-C is now calculated using the Martin-Hopkins  ?calculation, which is a validated novel method providing  ?better accuracy than the Friedewald equation in the  ?estimation of LDL-C.  ?Cresenciano Genre et al. Annamaria Helling. 3734;287(68): 2061-2068  ?(http://education.[employer].com/faq/FAQ164) ?  ? ?HDL  ?Date Value Ref Range Status  ?11/26/2021 41 (L) > OR = 50 mg/dL Final  ? ?Triglycerides  ?Date Value Ref Range Status  ?11/26/2021 109 <150 mg/dL Final  ? ?  ?  ?  Passed - Patient is not  pregnant  ?  ?  ? ? ?

## 2022-03-26 ENCOUNTER — Telehealth: Payer: Self-pay

## 2022-03-26 ENCOUNTER — Other Ambulatory Visit: Payer: Self-pay

## 2022-03-26 NOTE — Progress Notes (Signed)
Enter by error.

## 2022-03-26 NOTE — Telephone Encounter (Addendum)
Pt called requesting for refill on Ozempic, however, pt would like to increase her dose? Pt is tolerating will. Do pt pt need lab work? Before increasing dose?   Pls advice  Send to [facility] on [address].

## 2022-03-27 ENCOUNTER — Other Ambulatory Visit: Payer: Self-pay | Admitting: Family Medicine

## 2022-03-27 MED ORDER — SEMAGLUTIDE (1 MG/DOSE) 4 MG/3ML ~~LOC~~ SOPN: 1.0000 mg | PEN_INJECTOR | SUBCUTANEOUS | 2 refills | Status: DC

## 2022-03-27 NOTE — Telephone Encounter (Signed)
LM for pt to returncall

## 2022-03-27 NOTE — Telephone Encounter (Signed)
Pt call back, advice pt that her Rx for Ozempic '1mg'$  has been sent in per Dr. Dennard Schaumann.

## 2022-06-10 ENCOUNTER — Other Ambulatory Visit: Payer: Self-pay | Admitting: Family Medicine

## 2022-07-24 ENCOUNTER — Other Ambulatory Visit: Payer: Self-pay | Admitting: Family Medicine

## 2022-07-24 NOTE — Telephone Encounter (Signed)
Requested Prescriptions  Pending Prescriptions Disp Refills  . OZEMPIC, 1 MG/DOSE, 4 MG/3ML SOPN [Pharmacy Med Name: OZEMPIC (1 MG/DOSE) 4 MG/3ML SUBQ S] 3 mL 3    Sig: INJECT '1MG'$  ONCE A WEEK AS DIRECTED     Endocrinology:  Diabetes - GLP-1 Receptor Agonists - semaglutide Failed - 07/24/2022 12:06 PM      Failed - HBA1C in normal range and within 180 days    Hgb A1c MFr Bld  Date Value Ref Range Status  03/09/2020 5.0 <5.7 % of total Hgb Final    Comment:    For the purpose of screening for the presence of diabetes: . <5.7%       Consistent with the absence of diabetes 5.7-6.4%    Consistent with increased risk for diabetes             (prediabetes) > or =6.5%  Consistent with diabetes . This assay result is consistent with a decreased risk of diabetes. . Currently, no consensus exists regarding use of hemoglobin A1c for diagnosis of diabetes in children. . According to American Diabetes Association (ADA) guidelines, hemoglobin A1c <7.0% represents optimal control in non-pregnant diabetic patients. Different metrics may apply to specific patient populations.  Standards of Medical Care in Diabetes(ADA). .          Failed - Valid encounter within last 6 months    Recent Outpatient Visits          8 months ago Colon cancer screening   [place] Dennard Schaumann Cammie Mcgee, MD   1 year ago Viral upper respiratory tract infection   [place] Dennard Schaumann, Cammie Mcgee, MD   2 years ago Routine general medical examination at a health care facility   [place], Modena Nunnery, MD   2 years ago Essential hypertension   [place], Modena Nunnery, MD   3 years ago Acute rhinosinusitis   [place], Modena Nunnery, MD      Future Appointments            In 4 months [place], Cammie Mcgee, MD [place]           Passed - Cr in normal range and within 360 days    Creat   Date Value Ref Range Status  11/26/2021 0.91 0.50 - 1.03 mg/dL Final

## 2022-09-10 LAB — HM MAMMOGRAPHY

## 2022-09-24 ENCOUNTER — Other Ambulatory Visit: Payer: Self-pay | Admitting: Family Medicine

## 2022-09-24 NOTE — Telephone Encounter (Signed)
Requested Prescriptions  Pending Prescriptions Disp Refills   lisinopril (ZESTRIL) 10 MG tablet [Pharmacy Med Name: LISINOPRIL 10 MG TAB] 90 tablet 0    Sig: TAKE ONE TABLET ('10MG'$  TOTAL) BY MOUTH DAILY     Cardiovascular:  ACE Inhibitors Failed - 09/24/2022  2:48 PM      Failed - Cr in normal range and within 180 days    Creat  Date Value Ref Range Status  11/26/2021 0.91 0.50 - 1.03 mg/dL Final         Failed - K in normal range and within 180 days    Potassium  Date Value Ref Range Status  11/26/2021 4.3 3.5 - 5.3 mmol/L Final         Failed - Valid encounter within last 6 months    Recent Outpatient Visits           10 months ago Colon cancer screening   [place] Susy Frizzle, MD   1 year ago Viral upper respiratory tract infection   [place] Susy Frizzle, MD   2 years ago Routine general medical examination at a health care facility   [place], Modena Nunnery, MD   2 years ago Essential hypertension   [place], Modena Nunnery, MD   3 years ago Acute rhinosinusitis   [place], Modena Nunnery, MD       Future Appointments             In 2 months Pickard, Cammie Mcgee, MD [place], [place] - Patient is not pregnant      Passed - Last BP in normal range    BP Readings from Last 1 Encounters:  11/26/21 118/62

## 2022-11-17 ENCOUNTER — Other Ambulatory Visit: Payer: Self-pay | Admitting: Family Medicine

## 2022-11-17 DIAGNOSIS — Z1231 Encounter for screening mammogram for malignant neoplasm of breast: Secondary | ICD-10-CM

## 2022-12-08 ENCOUNTER — Encounter: Payer: Self-pay | Admitting: Family Medicine

## 2022-12-08 ENCOUNTER — Ambulatory Visit (INDEPENDENT_AMBULATORY_CARE_PROVIDER_SITE_OTHER): Payer: 59 | Admitting: Family Medicine

## 2022-12-08 VITALS — BP 122/70 | HR 99 | Temp 98.0°F | Ht 63.0 in | Wt 184.0 lb

## 2022-12-08 DIAGNOSIS — Z0001 Encounter for general adult medical examination with abnormal findings: Secondary | ICD-10-CM

## 2022-12-08 DIAGNOSIS — I10 Essential (primary) hypertension: Secondary | ICD-10-CM | POA: Diagnosis not present

## 2022-12-08 DIAGNOSIS — M25531 Pain in right wrist: Secondary | ICD-10-CM

## 2022-12-08 DIAGNOSIS — Z1211 Encounter for screening for malignant neoplasm of colon: Secondary | ICD-10-CM | POA: Diagnosis not present

## 2022-12-08 DIAGNOSIS — Z0000 Encounter for general adult medical examination without abnormal findings: Secondary | ICD-10-CM

## 2022-12-08 MED ORDER — DICLOFENAC SODIUM 75 MG PO TBEC: 75.0000 mg | DELAYED_RELEASE_TABLET | Freq: Two times a day (BID) | ORAL | 2 refills | Status: DC

## 2022-12-08 NOTE — Progress Notes (Signed)
Subjective:    Patient ID: Caitlin Henderson, female    DOB: 11/02/1965, 57 y.o.   MRN: [id number]  HPI Here for CPE.  I am very proud of this patient.  She is lost 28 pounds since last year only Ozempic.  She states that she is doing well.  She is only on 1 mg but she does not want to increase that at the present time.  She denies any side effects from the medication.  She does complain of some pain in her right wrist.  It is a dull aching pain over the distal ulna on the dorsal aspect of the wrist.  She denies any numbness or tingling in the fourth or fifth digit.  She denies any numbness or tingling or weakness in the first through third digits.  There is no palpable abnormality in that area.  There is no effusion or erythema or swelling.  She denies any falls or injuries.  She sees a gynecologist who performs her mammogram and her Pap smear.  She had a mammogram earlier this year.  This is up-to-date.  Her last Cologuard was in 2021.  She is due again this year but she would like to see GI for colonoscopy.  She is not taking calcium or vitamin D. Past Medical History:  Diagnosis Date   Frequent UTI    HLD (hyperlipidemia)    Hypertension    Past Surgical History:  Procedure Laterality Date   HERNIA REPAIR     Current Outpatient Medications on File Prior to Visit  Medication Sig Dispense Refill   atorvastatin (LIPITOR) 10 MG tablet TAKE ONE TABLET (10MG TOTAL) BY MOUTH DAILY 90 tablet 3   diclofenac (VOLTAREN) 75 MG EC tablet Take 1 tablet (75 mg total) by mouth 2 (two) times daily. (Patient taking differently: Take 75 mg by mouth 2 (two) times daily. PRN) 50 tablet 2   lisinopril (ZESTRIL) 10 MG tablet TAKE ONE TABLET (10MG TOTAL) BY MOUTH DAILY 90 tablet 0   OZEMPIC, 1 MG/DOSE, 4 MG/3ML SOPN INJECT 1MG ONCE A WEEK AS DIRECTED 3 mL 3   No current facility-administered medications on file prior to visit.   Allergies  Allergen Reactions   Bee Venom    Social History   Socioeconomic  History   Marital status: Married    Spouse name: Not on file   Number of children: Not on file   Years of education: Not on file   Highest education level: Not on file  Occupational History   Not on file  Tobacco Use   Smoking status: Never   Smokeless tobacco: Never  Substance and Sexual Activity   Alcohol use: No    Alcohol/week: 0.0 standard drinks of alcohol   Drug use: No   Sexual activity: Not on file  Other Topics Concern   Not on file  Social History Narrative   Not on file   Social Determinants of Health   Financial Resource Strain: Not on file  Food Insecurity: Not on file  Transportation Needs: Not on file  Physical Activity: Not on file  Stress: Not on file  Social Connections: Not on file  Intimate Partner Violence: Not on file     Review of Systems  All other systems reviewed and are negative.      Objective:   Physical Exam Vitals reviewed.  Constitutional:      General: She is not in acute distress.    Appearance: Normal appearance. She is obese. She is  not ill-appearing, toxic-appearing or diaphoretic.  HENT:     Head: Normocephalic and atraumatic.     Right Ear: Tympanic membrane and ear canal normal.     Left Ear: Tympanic membrane and ear canal normal.     Nose: No congestion or rhinorrhea.     Mouth/Throat:     Mouth: Mucous membranes are moist.     Pharynx: Oropharynx is clear. No oropharyngeal exudate or posterior oropharyngeal erythema.  Eyes:     General:        Right eye: No discharge.        Left eye: No discharge.     Conjunctiva/sclera: Conjunctivae normal.  Neck:     Vascular: No carotid bruit.  Cardiovascular:     Rate and Rhythm: Normal rate and regular rhythm.     Heart sounds: Normal heart sounds. No murmur heard.    No friction rub. No gallop.  Pulmonary:     Effort: Pulmonary effort is normal. No respiratory distress.     Breath sounds: Normal breath sounds. No stridor. No wheezing, rhonchi or rales.  Chest:      Chest wall: No tenderness.  Abdominal:     General: Abdomen is flat. Bowel sounds are normal. There is no distension.     Palpations: Abdomen is soft.     Tenderness: There is no abdominal tenderness. There is no guarding or rebound.     Hernia: No hernia is present.  Musculoskeletal:     Cervical back: Neck supple. No rigidity or tenderness.     Right lower leg: No edema.     Left lower leg: No edema.  Lymphadenopathy:     Cervical: No cervical adenopathy.  Skin:    Coloration: Skin is not jaundiced.     Findings: No bruising, lesion or rash.  Neurological:     General: No focal deficit present.     Mental Status: She is alert and oriented to person, place, and time.     Cranial Nerves: No cranial nerve deficit.     Sensory: No sensory deficit.     Motor: No weakness.     Coordination: Coordination normal.     Gait: Gait normal.     Deep Tendon Reflexes: Reflexes normal.  Psychiatric:        Mood and Affect: Mood normal.        Behavior: Behavior normal.        Thought Content: Thought content normal.        Judgment: Judgment normal.    Wt Readings from Last 3 Encounters:  12/08/22 184 lb (83.5 kg)  11/26/21 212 lb (96.2 kg)  05/16/21 217 lb (98.4 kg)          Assessment & Plan:  Routine general medical examination at a health care facility  Benign essential HTN - Plan: CBC with Differential/Platelet, COMPLETE METABOLIC PANEL WITH GFR, Lipid panel  Colon cancer screening - Plan: Ambulatory referral to Gastroenterology  Right wrist pain - Plan: DG Wrist Complete Right I believe that she likely has osteoarthritis in the wrist however I would like to get an x-ray first to evaluate.  I will refill her diclofenac that she uses sparingly for pain in her feet and her ankles.  Continue Ozempic at 1 mg weekly.  At the present time, the patient has no desire to increase.  Her mammogram, and Pap smear are up-to-date.  I will consult GI about a colonoscopy.  I did recommend  1200 mg a day  of calcium and 1000 units a day of vitamin D.  She is not due for bone density testing until 66.  Consult GI regarding a colonoscopy this year.

## 2022-12-09 LAB — CBC WITH DIFFERENTIAL/PLATELET
Absolute Monocytes: 442 cells/uL (ref 200–950)
Basophils Absolute: 61 cells/uL (ref 0–200)
Basophils Relative: 0.9 %
Eosinophils Absolute: 68 cells/uL (ref 15–500)
Eosinophils Relative: 1 %
HCT: 39.1 % (ref 35.0–45.0)
Hemoglobin: 13.4 g/dL (ref 11.7–15.5)
Lymphs Abs: 1856 cells/uL (ref 850–3900)
MCH: 30.4 pg (ref 27.0–33.0)
MCHC: 34.3 g/dL (ref 32.0–36.0)
MCV: 88.7 fL (ref 80.0–100.0)
MPV: 11.2 fL (ref 7.5–12.5)
Monocytes Relative: 6.5 %
Neutro Abs: 4372 cells/uL (ref 1500–7800)
Neutrophils Relative %: 64.3 %
Platelets: 304 10*3/uL (ref 140–400)
RBC: 4.41 10*6/uL (ref 3.80–5.10)
RDW: 12.3 % (ref 11.0–15.0)
Total Lymphocyte: 27.3 %
WBC: 6.8 10*3/uL (ref 3.8–10.8)

## 2022-12-09 LAB — COMPLETE METABOLIC PANEL WITH GFR
AG Ratio: 1.7 (calc) (ref 1.0–2.5)
ALT: 10 U/L (ref 6–29)
AST: 13 U/L (ref 10–35)
Albumin: 4.5 g/dL (ref 3.6–5.1)
Alkaline phosphatase (APISO): 109 U/L (ref 37–153)
BUN: 15 mg/dL (ref 7–25)
CO2: 23 mmol/L (ref 20–32)
Calcium: 9.6 mg/dL (ref 8.6–10.4)
Chloride: 106 mmol/L (ref 98–110)
Creat: 0.99 mg/dL (ref 0.50–1.03)
Globulin: 2.7 g/dL (calc) (ref 1.9–3.7)
Glucose, Bld: 79 mg/dL (ref 65–99)
Potassium: 4.1 mmol/L (ref 3.5–5.3)
Sodium: 141 mmol/L (ref 135–146)
Total Bilirubin: 0.6 mg/dL (ref 0.2–1.2)
Total Protein: 7.2 g/dL (ref 6.1–8.1)
eGFR: 67 mL/min/{1.73_m2} (ref >=60)

## 2022-12-09 LAB — LIPID PANEL
Cholesterol: 157 mg/dL (ref <200)
HDL: 40 mg/dL — ABNORMAL LOW (ref >=50)
LDL Cholesterol (Calc): 98 mg/dL (calc)
Non-HDL Cholesterol (Calc): 117 mg/dL (calc) (ref <130)
Total CHOL/HDL Ratio: 3.9 (calc) (ref <5.0)
Triglycerides: 96 mg/dL (ref <150)

## 2022-12-19 ENCOUNTER — Ambulatory Visit
Admission: RE | Admit: 2022-12-19 | Discharge: 2022-12-19 | Disposition: A | Discharge status: Home / Self Care | Payer: 59 | Source: Ambulatory Visit | Attending: Family Medicine | Admitting: Family Medicine

## 2022-12-19 DIAGNOSIS — M25531 Pain in right wrist: Secondary | ICD-10-CM

## 2022-12-23 ENCOUNTER — Other Ambulatory Visit: Payer: Self-pay | Admitting: Family Medicine

## 2022-12-23 NOTE — Telephone Encounter (Signed)
Prescription Request  12/23/2022  Is this a "Controlled Substance" medicine? No  LOV: 12/08/2022  What is the name of the medication or equipment?  Lisinopril 10 mg tab #90  Have you contacted your pharmacy to request a refill? Yes   Which pharmacy would you like this sent to?  [place], [place] [address] Phone: [phone] Fax: [phone]    Patient notified that their request is being sent to the clinical staff for review and that they should receive a response within 2 business days.   Please advise at [facility] [phone]

## 2022-12-24 MED ORDER — LISINOPRIL 10 MG PO TABS: ORAL_TABLET | ORAL | 0 refills | Status: DC

## 2022-12-24 NOTE — Telephone Encounter (Signed)
Requested Prescriptions  Pending Prescriptions Disp Refills   lisinopril (ZESTRIL) 10 MG tablet 90 tablet 0    Sig: TAKE ONE TABLET ('10MG'$  TOTAL) BY MOUTH DAILY     Cardiovascular:  ACE Inhibitors Failed - 12/23/2022  4:53 PM      Failed - Valid encounter within last 6 months    Recent Outpatient Visits           1 year ago Colon cancer screening   [place] Susy Frizzle, MD   1 year ago Viral upper respiratory tract infection   [place], Cammie Mcgee, MD   2 years ago Routine general medical examination at a health care facility   [place], Modena Nunnery, MD   2 years ago Essential hypertension   [place], Modena Nunnery, MD   4 years ago Acute rhinosinusitis   [place], Modena Nunnery, MD              Passed - Cr in normal range and within 180 days    Creat  Date Value Ref Range Status  12/08/2022 0.99 0.50 - 1.03 mg/dL Final         Passed - K in normal range and within 180 days    Potassium  Date Value Ref Range Status  12/08/2022 4.1 3.5 - 5.3 mmol/L Final         Passed - Patient is not pregnant      Passed - Last BP in normal range    BP Readings from Last 1 Encounters:  12/08/22 122/70

## 2022-12-29 ENCOUNTER — Encounter: Payer: Self-pay | Admitting: Family Medicine

## 2023-03-22 ENCOUNTER — Other Ambulatory Visit: Payer: Self-pay | Admitting: Family Medicine

## 2023-03-24 ENCOUNTER — Other Ambulatory Visit: Payer: Self-pay | Admitting: Family Medicine

## 2023-03-24 NOTE — Telephone Encounter (Signed)
Requested Prescriptions  Pending Prescriptions Disp Refills   OZEMPIC, 1 MG/DOSE, 4 MG/3ML SOPN [Pharmacy Med Name: OZEMPIC 1MG  PER DOSE (4MG /3ML) PFP] 3 mL 0    Sig: INJECT 1 MG UNDER THE SKIN ONE DAY A WEEK     Endocrinology:  Diabetes - GLP-1 Receptor Agonists - semaglutide Failed - 03/22/2023 11:24 AM      Failed - HBA1C in normal range and within 180 days    Hgb A1c MFr Bld  Date Value Ref Range Status  03/09/2020 5.0 <5.7 % of total Hgb Final    Comment:    For the purpose of screening for the presence of diabetes: . <5.7%       Consistent with the absence of diabetes 5.7-6.4%    Consistent with increased risk for diabetes             (prediabetes) > or =6.5%  Consistent with diabetes . This assay result is consistent with a decreased risk of diabetes. . Currently, no consensus exists regarding use of hemoglobin A1c for diagnosis of diabetes in children. . According to American Diabetes Association (ADA) guidelines, hemoglobin A1c <7.0% represents optimal control in non-pregnant diabetic patients. Different metrics may apply to specific patient populations.  Standards of Medical Care in Diabetes(ADA). .          Failed - Valid encounter within last 6 months    Recent Outpatient Visits           1 year ago Colon cancer screening   [facility] Tanya Nones Priscille Heidelberg, MD   1 year ago Viral upper respiratory tract infection   [facility] Tanya Nones, Priscille Heidelberg, MD   2 years ago Routine general medical examination at a health care facility   [facility] [place], Velna Hatchet, MD   3 years ago Essential hypertension   [facility] [place], Velna Hatchet, MD   4 years ago Acute rhinosinusitis   [facility] [place], Velna Hatchet, MD              Passed - Cr in normal range and within 360 days    Creat  Date Value Ref Range Status  12/08/2022 0.99 0.50 - 1.03 mg/dL Final

## 2023-06-03 ENCOUNTER — Other Ambulatory Visit: Payer: Self-pay | Admitting: Family Medicine

## 2023-06-04 NOTE — Telephone Encounter (Signed)
Requested Prescriptions  Pending Prescriptions Disp Refills   atorvastatin (LIPITOR) 10 MG tablet [Pharmacy Med Name: ATORVASTATIN CALCIUM 10 MG TAB] 90 tablet 1    Sig: TAKE ONE TABLET (10MG  TOTAL) BY MOUTH DAILY     Cardiovascular:  Antilipid - Statins Failed - 06/03/2023 12:57 PM      Failed - Valid encounter within last 12 months    Recent Outpatient Visits           1 year ago Colon cancer screening   [facility] Donita [place], MD   2 years ago Viral upper respiratory tract infection   [facility] Pickard, Priscille Heidelberg, MD   2 years ago Routine general medical examination at a health care facility   [facility] [place], Velna Hatchet, MD   3 years ago Essential hypertension   [facility] [place], Velna Hatchet, MD   4 years ago Acute rhinosinusitis   [facility] [place], Velna Hatchet, MD              Failed - Lipid Panel in normal range within the last 12 months    Cholesterol  Date Value Ref Range Status  12/08/2022 157 <200 mg/dL Final   LDL Cholesterol (Calc)  Date Value Ref Range Status  12/08/2022 98 mg/dL (calc) Final    Comment:    Reference range: <100 . Desirable range <100 mg/dL for primary prevention;   <70 mg/dL for patients with CHD or diabetic patients  with > or = 2 CHD risk factors. Marland Kitchen LDL-C is now calculated using the Martin-Hopkins  calculation, which is a validated novel method providing  better accuracy than the Friedewald equation in the  estimation of LDL-C.  Horald Pollen et al. Lenox Ahr. 9518;841(66): 2061-2068  (http://education.[facility].com/faq/FAQ164)    HDL  Date Value Ref Range Status  12/08/2022 40 (L) > OR = 50 mg/dL Final   Triglycerides  Date Value Ref Range Status  12/08/2022 96 <150 mg/dL Final         Passed - Patient is not pregnant

## 2023-06-08 ENCOUNTER — Other Ambulatory Visit: Payer: Self-pay

## 2023-06-08 ENCOUNTER — Other Ambulatory Visit: Payer: Self-pay | Admitting: Family Medicine

## 2023-06-08 DIAGNOSIS — I10 Essential (primary) hypertension: Secondary | ICD-10-CM

## 2023-06-08 DIAGNOSIS — E7800 Pure hypercholesterolemia, unspecified: Secondary | ICD-10-CM

## 2023-06-08 MED ORDER — LISINOPRIL 10 MG PO TABS: ORAL_TABLET | ORAL | 1 refills | Status: DC

## 2023-06-08 MED ORDER — ATORVASTATIN CALCIUM 10 MG PO TABS: ORAL_TABLET | ORAL | 1 refills | Status: DC

## 2023-06-10 NOTE — Telephone Encounter (Signed)
Medication refilled 06/08/23, duplicate request. E-Prescribing Status: Receipt confirmed by pharmacy (06/08/2023  4:11 PM EDT).  Requested Prescriptions  Pending Prescriptions Disp Refills   atorvastatin (LIPITOR) 10 MG tablet [Pharmacy Med Name: ATORVASTATIN CALCIUM 10 MG TAB] 90 tablet 1    Sig: TAKE ONE TABLET (10MG  TOTAL) BY MOUTH DAILY     Cardiovascular:  Antilipid - Statins Failed - 06/08/2023  1:54 PM      Failed - Valid encounter within last 12 months    Recent Outpatient Visits           1 year ago Colon cancer screening   [facility] Donita [place], MD   2 years ago Viral upper respiratory tract infection   [facility] Pickard, Priscille Heidelberg, MD   2 years ago Routine general medical examination at a health care facility   [facility] [place], Velna Hatchet, MD   3 years ago Essential hypertension   [facility] [place], Velna Hatchet, MD   4 years ago Acute rhinosinusitis   [facility] [place], Velna Hatchet, MD              Failed - Lipid Panel in normal range within the last 12 months    Cholesterol  Date Value Ref Range Status  12/08/2022 157 <200 mg/dL Final   LDL Cholesterol (Calc)  Date Value Ref Range Status  12/08/2022 98 mg/dL (calc) Final    Comment:    Reference range: <100 . Desirable range <100 mg/dL for primary prevention;   <70 mg/dL for patients with CHD or diabetic patients  with > or = 2 CHD risk factors. Marland Kitchen LDL-C is now calculated using the Martin-Hopkins  calculation, which is a validated novel method providing  better accuracy than the Friedewald equation in the  estimation of LDL-C.  Horald Pollen et al. Lenox Ahr. 4696;295(28): 2061-2068  (http://education.[facility].com/faq/FAQ164)    HDL  Date Value Ref Range Status  12/08/2022 40 (L) > OR = 50 mg/dL Final   Triglycerides  Date Value Ref Range Status  12/08/2022 96 <150 mg/dL Final         Passed - Patient is  not pregnant

## 2023-12-04 ENCOUNTER — Other Ambulatory Visit: Payer: Self-pay | Admitting: Family Medicine

## 2023-12-04 DIAGNOSIS — E7800 Pure hypercholesterolemia, unspecified: Secondary | ICD-10-CM

## 2023-12-21 ENCOUNTER — Other Ambulatory Visit: Payer: Self-pay | Admitting: Family Medicine

## 2023-12-21 DIAGNOSIS — I10 Essential (primary) hypertension: Secondary | ICD-10-CM

## 2024-01-22 ENCOUNTER — Encounter: Payer: Self-pay | Admitting: Family Medicine

## 2024-01-22 ENCOUNTER — Ambulatory Visit: Admitting: Family Medicine

## 2024-01-22 VITALS — BP 122/74 | HR 83 | Ht 63.0 in | Wt 201.0 lb

## 2024-01-22 DIAGNOSIS — Z1211 Encounter for screening for malignant neoplasm of colon: Secondary | ICD-10-CM

## 2024-01-22 DIAGNOSIS — Z0001 Encounter for general adult medical examination with abnormal findings: Secondary | ICD-10-CM | POA: Diagnosis not present

## 2024-01-22 DIAGNOSIS — I10 Essential (primary) hypertension: Secondary | ICD-10-CM

## 2024-01-22 DIAGNOSIS — E7800 Pure hypercholesterolemia, unspecified: Secondary | ICD-10-CM | POA: Diagnosis not present

## 2024-01-22 DIAGNOSIS — N390 Urinary tract infection, site not specified: Secondary | ICD-10-CM | POA: Diagnosis not present

## 2024-01-22 DIAGNOSIS — Z0000 Encounter for general adult medical examination without abnormal findings: Secondary | ICD-10-CM

## 2024-01-22 DIAGNOSIS — R5383 Other fatigue: Secondary | ICD-10-CM

## 2024-01-22 LAB — URINALYSIS, ROUTINE W REFLEX MICROSCOPIC
Bilirubin Urine: NEGATIVE
Casts: NONE SEEN /LPF
Crystals: NONE SEEN /HPF
Glucose, UA: NEGATIVE
Ketones, ur: NEGATIVE
Nitrite: NEGATIVE
Protein, ur: NEGATIVE
Specific Gravity, Urine: 1.025 (ref 1.001–1.035)
Yeast: NONE SEEN /HPF
pH: 5.5 (ref 5.0–8.0)

## 2024-01-22 LAB — MICROSCOPIC MESSAGE

## 2024-01-22 MED ORDER — FLUCONAZOLE 150 MG PO TABS: 150.0000 mg | ORAL_TABLET | Freq: Every day | ORAL | 0 refills | Status: AC

## 2024-01-22 MED ORDER — CEPHALEXIN 500 MG PO CAPS: 500.0000 mg | ORAL_CAPSULE | Freq: Three times a day (TID) | ORAL | 0 refills | Status: AC

## 2024-01-22 NOTE — Progress Notes (Signed)
 Subjective:    Patient ID: Caitlin Henderson, female    DOB: [birth year], 58 y.o.   MRN: [id number]  HPI Patient is here today for a complete physical exam.  However she also reports increased urinary frequency and urgency with some mild dysuria.  Urinalysis shows 6-10 white blood cells and few bacteria with +1 leukocyte esterase and trace blood.  Patient's Cologuard was in 2021.  This is due to.  Her mammogram and Pap smear was just performed by her gynecologist and is up-to-date.  She is due for the shingles vaccine.  She is due for the pneumonia vaccine.  She defers the shots today.  Her tetanus shot is not due again until 2030.  She does report fatigue.  She states that she is tired all the time.  She also reports hypersomnolence.  Her husband states that she snores. Past Medical History:  Diagnosis Date   Frequent UTI    HLD (hyperlipidemia)    Hypertension    Past Surgical History:  Procedure Laterality Date   HERNIA REPAIR     Current Outpatient Medications on File Prior to Visit  Medication Sig Dispense Refill   atorvastatin (LIPITOR) 10 MG tablet TAKE ONE TABLET (10MG  TOTAL) BY MOUTH DAILY 90 tablet 1   diclofenac (VOLTAREN) 75 MG EC tablet Take 1 tablet (75 mg total) by mouth 2 (two) times daily. 60 tablet 2   lisinopril (ZESTRIL) 10 MG tablet TAKE ONE TABLET (10MG  TOTAL) BY MOUTH DAILY 90 tablet 1   Semaglutide, 1 MG/DOSE, (OZEMPIC, 1 MG/DOSE,) 4 MG/3ML SOPN INJECT 1 MG UNDER THE SKIN ONE DAY A WEEK (Patient not taking: Reported on 01/22/2024) 3 mL 0   No current facility-administered medications on file prior to visit.   Allergies  Allergen Reactions   Bee Venom    Social History   Socioeconomic History   Marital status: Married    Spouse name: Not on file   Number of children: Not on file   Years of education: Not on file   Highest education level: Not on file  Occupational History   Not on file  Tobacco Use   Smoking status: Never   Smokeless tobacco: Never  Substance  and Sexual Activity   Alcohol use: No    Alcohol/week: 0.0 standard drinks of alcohol   Drug use: No   Sexual activity: Not on file  Other Topics Concern   Not on file  Social History Narrative   Not on file   Social Drivers of Health   Financial Resource Strain: Not on file  Food Insecurity: Not on file  Transportation Needs: Not on file  Physical Activity: Not on file  Stress: Not on file  Social Connections: Not on file  Intimate Partner Violence: Not on file     Review of Systems  All other systems reviewed and are negative.      Objective:   Physical Exam Vitals reviewed.  Constitutional:      General: She is not in acute distress.    Appearance: Normal appearance. She is obese. She is not ill-appearing, toxic-appearing or diaphoretic.  HENT:     Head: Normocephalic and atraumatic.     Right Ear: Tympanic membrane and ear canal normal.     Left Ear: Tympanic membrane and ear canal normal.     Nose: No congestion or rhinorrhea.     Mouth/Throat:     Mouth: Mucous membranes are moist.     Pharynx: Oropharynx is clear. No oropharyngeal exudate  or posterior oropharyngeal erythema.  Eyes:     General:        Right eye: No discharge.        Left eye: No discharge.     Conjunctiva/sclera: Conjunctivae normal.  Neck:     Vascular: No carotid bruit.  Cardiovascular:     Rate and Rhythm: Normal rate and regular rhythm.     Heart sounds: Normal heart sounds. No murmur heard.    No friction rub. No gallop.  Pulmonary:     Effort: Pulmonary effort is normal. No respiratory distress.     Breath sounds: Normal breath sounds. No stridor. No wheezing, rhonchi or rales.  Chest:     Chest wall: No tenderness.  Abdominal:     General: Abdomen is flat. Bowel sounds are normal. There is no distension.     Palpations: Abdomen is soft.     Tenderness: There is no abdominal tenderness. There is no guarding or rebound.     Hernia: No hernia is present.  Musculoskeletal:      Cervical back: Neck supple. No rigidity or tenderness.     Right lower leg: No edema.     Left lower leg: No edema.  Lymphadenopathy:     Cervical: No cervical adenopathy.  Skin:    Coloration: Skin is not jaundiced.     Findings: No bruising, lesion or rash.  Neurological:     General: No focal deficit present.     Mental Status: She is alert and oriented to person, place, and time.     Cranial Nerves: No cranial nerve deficit.     Sensory: No sensory deficit.     Motor: No weakness.     Coordination: Coordination normal.     Gait: Gait normal.     Deep Tendon Reflexes: Reflexes normal.  Psychiatric:        Mood and Affect: Mood normal.        Behavior: Behavior normal.        Thought Content: Thought content normal.        Judgment: Judgment normal.    Wt Readings from Last 3 Encounters:  01/22/24 201 lb (91.2 kg)  12/08/22 184 lb (83.5 kg)  11/26/21 212 lb (96.2 kg)          Assessment & Plan:  Frequent UTI - Plan: Urinalysis, Routine w reflex microscopic  Benign essential HTN - Plan: CBC with Differential/Platelet, COMPLETE METABOLIC PANEL WITHOUT GFR, Lipid panel, TSH  Pure hypercholesterolemia - Plan: CBC with Differential/Platelet, COMPLETE METABOLIC PANEL WITHOUT GFR, Lipid panel, TSH  Routine general medical examination at a health care facility - Plan: CBC with Differential/Platelet, COMPLETE METABOLIC PANEL WITHOUT GFR, Lipid panel, TSH  Fatigue, unspecified type - Plan: CBC with Differential/Platelet, COMPLETE METABOLIC PANEL WITHOUT GFR, Lipid panel, TSH, Vitamin B12  Colon cancer screening - Plan: Cologuard Physical exam today significant only for an elevated BMI.  I recommended the shingles vaccine as well as the pneumonia vaccine.  She politely declined these today.  Tetanus shot is up-to-date.  Mammogram and Pap smear up-to-date.  Schedule patient for Cologuard for colon cancer screening.  Check CBC CMP and a lipid panel to monitor her cholesterol.   Given her fatigue I will check a TSH and a B12.  If labs are normal, consider a sleep study to evaluate for sleep apnea.  Treat urinary tract infection with Keflex 500 mg 3 times daily for 5 days

## 2024-01-23 LAB — CBC WITH DIFFERENTIAL/PLATELET
Absolute Lymphocytes: 2010 {cells}/uL (ref 850–3900)
Absolute Monocytes: 469 {cells}/uL (ref 200–950)
Basophils Absolute: 80 {cells}/uL (ref 0–200)
Basophils Relative: 1.2 %
Eosinophils Absolute: 107 {cells}/uL (ref 15–500)
Eosinophils Relative: 1.6 %
HCT: 40.2 % (ref 35.0–45.0)
Hemoglobin: 13.1 g/dL (ref 11.7–15.5)
MCH: 28.7 pg (ref 27.0–33.0)
MCHC: 32.6 g/dL (ref 32.0–36.0)
MCV: 88.2 fL (ref 80.0–100.0)
MPV: 11.1 fL (ref 7.5–12.5)
Monocytes Relative: 7 %
Neutro Abs: 4033 {cells}/uL (ref 1500–7800)
Neutrophils Relative %: 60.2 %
Platelets: 321 10*3/uL (ref 140–400)
RBC: 4.56 10*6/uL (ref 3.80–5.10)
RDW: 12.7 % (ref 11.0–15.0)
Total Lymphocyte: 30 %
WBC: 6.7 10*3/uL (ref 3.8–10.8)

## 2024-01-23 LAB — COMPLETE METABOLIC PANEL WITHOUT GFR
AG Ratio: 2.1 (calc) (ref 1.0–2.5)
ALT: 13 U/L (ref 6–29)
AST: 14 U/L (ref 10–35)
Albumin: 4.7 g/dL (ref 3.6–5.1)
Alkaline phosphatase (APISO): 107 U/L (ref 37–153)
BUN: 14 mg/dL (ref 7–25)
CO2: 27 mmol/L (ref 20–32)
Calcium: 9.9 mg/dL (ref 8.6–10.4)
Chloride: 105 mmol/L (ref 98–110)
Creat: 0.87 mg/dL (ref 0.50–1.03)
Globulin: 2.2 g/dL (ref 1.9–3.7)
Glucose, Bld: 85 mg/dL (ref 65–99)
Potassium: 4.3 mmol/L (ref 3.5–5.3)
Sodium: 140 mmol/L (ref 135–146)
Total Bilirubin: 0.6 mg/dL (ref 0.2–1.2)
Total Protein: 6.9 g/dL (ref 6.1–8.1)

## 2024-01-23 LAB — VITAMIN B12: Vitamin B-12: 319 pg/mL (ref 200–1100)

## 2024-01-23 LAB — LIPID PANEL
Cholesterol: 168 mg/dL (ref <200)
HDL: 48 mg/dL — ABNORMAL LOW (ref >=50)
LDL Cholesterol (Calc): 99 mg/dL
Non-HDL Cholesterol (Calc): 120 mg/dL (ref <130)
Total CHOL/HDL Ratio: 3.5 (calc) (ref <5.0)
Triglycerides: 112 mg/dL (ref <150)

## 2024-01-23 LAB — TSH: TSH: 2.7 m[IU]/L (ref 0.40–4.50)

## 2024-02-13 LAB — COLOGUARD: COLOGUARD: NEGATIVE

## 2024-02-22 ENCOUNTER — Other Ambulatory Visit: Payer: Self-pay | Admitting: Family Medicine

## 2024-06-03 ENCOUNTER — Other Ambulatory Visit: Payer: Self-pay | Admitting: Family Medicine

## 2024-06-03 DIAGNOSIS — E7800 Pure hypercholesterolemia, unspecified: Secondary | ICD-10-CM

## 2024-06-06 ENCOUNTER — Telehealth: Payer: Self-pay

## 2024-06-06 ENCOUNTER — Other Ambulatory Visit: Payer: Self-pay

## 2024-06-06 DIAGNOSIS — E7800 Pure hypercholesterolemia, unspecified: Secondary | ICD-10-CM

## 2024-06-06 MED ORDER — ATORVASTATIN CALCIUM 10 MG PO TABS: ORAL_TABLET | ORAL | 1 refills | Status: AC

## 2024-06-06 NOTE — Telephone Encounter (Signed)
 Copied from CRM #[id number]. Topic: Clinical - Medication Question >> Jun 06, 2024 11:07 AM Gustabo D wrote: atorvastatin  (LIPITOR) 10 MG tablet - fu with pharmacy medication is pending Patient has a allergy to bees and wants to know if she can get a prescription for a Epipen 

## 2024-06-07 ENCOUNTER — Other Ambulatory Visit: Payer: Self-pay | Admitting: Family Medicine

## 2024-06-07 MED ORDER — EPINEPHRINE 0.3 MG/0.3ML IJ SOAJ: 0.3000 mg | INTRAMUSCULAR | 3 refills | Status: AC | PRN

## 2024-06-13 ENCOUNTER — Encounter: Payer: Self-pay | Admitting: Family Medicine

## 2024-06-13 ENCOUNTER — Ambulatory Visit: Admitting: Family Medicine

## 2024-06-13 VITALS — BP 104/70 | HR 74 | Temp 98.2°F | Ht 63.0 in | Wt 207.0 lb

## 2024-06-13 DIAGNOSIS — H6991 Unspecified Eustachian tube disorder, right ear: Secondary | ICD-10-CM

## 2024-06-13 MED ORDER — FLUTICASONE PROPIONATE 50 MCG/ACT NA SUSP: 2.0000 | Freq: Every day | NASAL | 11 refills | Status: AC

## 2024-06-13 MED ORDER — PREDNISONE 10 MG PO TABS: 10.0000 mg | ORAL_TABLET | Freq: Every day | ORAL | 0 refills | Status: AC

## 2024-06-13 NOTE — Progress Notes (Signed)
 Patient Office Visit  Assessment & Plan:  Eustachian tube dysfunction, right -     Fluticasone  Propionate; Place 2 sprays into both nostrils daily.  Dispense: 16 g; Refill: 11 -     predniSONE ; Take 1 tablet (10 mg total) by mouth daily.  Dispense: 7 tablet; Refill: 0   Assessment and Plan    eustachian tube dysfunction- Fluid behind right tympanic membrane without infection. Sensitive to prednisone  and Sudafed. - Prescribed Flonase  nasal spray to [employer]. - Prescribed prednisone  10 mg daily for 7 days. - Advised to call if symptoms persist.  Chronic nasal congestion Morning congestion without current medication use. Sensitive to Sudafed. - Prescribed Flonase  nasal spray.          No follow-ups on file.   Subjective:    Patient ID: Caitlin Henderson, female    DOB: [birth year]  Age: 58 y.o. MRN: [id number]  Chief Complaint  Patient presents with   Ear Pain    Right ear feels like fluid in it. 10-12 days    HPI Discussed the use of AI scribe software for clinical note transcription with the patient, who gave verbal consent to proceed.  History of Present Illness        Caitlin Henderson is a 58 year old female who presents with right ear discomfort and congestion.  She experiences discomfort in her right ear, describing it as a sensation of water being trapped. Symptoms began at the end of the week before last, were particularly bothersome last week, eased by Friday, but returned this morning. No fever, chills, or pain are associated with the ear discomfort. She has not used any treatments for the ear discomfort and denies any history of frequent ear infections, although she had swimmer's ear once as a child. No fever or chills.   She also experiences congestion, particularly upon waking in the morning. She is prone to sinus infections but has not been blowing anything out recently. She has not taken any medication for the congestion lately, although she mentions using  Aleve if symptoms worsen. She has used Flonase  in the past for similar symptoms.  Her current medications include a cholesterol medication and lisinopril . She has previously taken semaglutide  for weight management but is not currently using it. 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 She mentions having gone swimming in June, but not recently. Physical Exam HEENT: Fluid behind right  tympanic membranes, no cerumen. Nasal mucosa swollen. Results Assessment & Plan eustachian tube dysfunction- Fluid behind right tympanic membrane without infection. Sensitive to prednisone  and Sudafed. - Prescribed Flonase  nasal spray to [employer]. - Prescribed prednisone  10 mg daily for 7 days. - Advised to call if symptoms persist.  Chronic nasal congestion Morning congestion without current medication use. Sensitive to Sudafed. - Prescribed Flonase  nasal spray.    The 10-year ASCVD risk score (Arnett DK, et al., 2019) is: 2.1%  Past Medical History:  Diagnosis Date   Frequent UTI    HLD (hyperlipidemia)    Hypertension    Past Surgical History:  Procedure Laterality Date   HERNIA REPAIR     Social History   Tobacco Use   Smoking status: Never   Smokeless tobacco: Never  Substance Use Topics   Alcohol use: No    Alcohol/week: 0.0 standard drinks of alcohol  Drug use: No   History reviewed. No pertinent family history. Allergies  Allergen Reactions   Bee Venom     ROS    Objective:    BP 104/70   Pulse 74   Temp 98.2 F (36.8 C)   Ht 5' 3 (1.6 m)   Wt 207 lb (93.9 kg)   SpO2 98%   BMI 36.67 kg/m  BP Readings from Last 3 Encounters:  06/13/24 104/70  01/22/24 122/74  12/08/22 122/70   Wt Readings from Last 3 Encounters:  06/13/24 207 lb (93.9 kg)  01/22/24 201 lb (91.2 kg)   12/08/22 184 lb (83.5 kg)    Physical Exam Vitals and nursing note reviewed.  Constitutional:      General: She is not in acute distress.    Appearance: Normal appearance.  HENT:     Head: Normocephalic.     Right Ear: Hearing and ear canal normal. A middle ear effusion is present. Tympanic membrane is retracted. Tympanic membrane is not perforated or erythematous.     Left Ear: Hearing, tympanic membrane, ear canal and external ear normal.  No middle ear effusion. Tympanic membrane is not erythematous.     Nose: Congestion present.     Right Turbinates: Swollen.     Left Turbinates: Not swollen.  Eyes:     Extraocular Movements: Extraocular movements intact.     Pupils: Pupils are equal, round, and reactive to light.  Cardiovascular:     Rate and Rhythm: Normal rate and regular rhythm.     Heart sounds: Normal heart sounds.  Pulmonary:     Effort: Pulmonary effort is normal.     Breath sounds: Normal breath sounds. No wheezing.  Neurological:     Mental Status: She is alert.  Psychiatric:        Mood and Affect: Mood normal.        Behavior: Behavior normal.      No results found for any visits on 06/13/24.

## 2024-06-21 ENCOUNTER — Other Ambulatory Visit: Payer: Self-pay | Admitting: Family Medicine

## 2024-06-21 DIAGNOSIS — I10 Essential (primary) hypertension: Secondary | ICD-10-CM

## 2024-06-22 NOTE — Telephone Encounter (Signed)
 Requested Prescriptions  Pending Prescriptions Disp Refills   lisinopril  (ZESTRIL ) 10 MG tablet [Pharmacy Med Name: LISINOPRIL  10 MG TAB] 90 tablet 0    Sig: TAKE ONE TABLET (10MG  TOTAL) BY MOUTH DAILY     Cardiovascular:  ACE Inhibitors Passed - 06/22/2024  4:02 PM      Passed - Cr in normal range and within 180 days    Creat  Date Value Ref Range Status  01/22/2024 0.87 0.50 - 1.03 mg/dL Final         Passed - K in normal range and within 180 days    Potassium  Date Value Ref Range Status  01/22/2024 4.3 3.5 - 5.3 mmol/L Final         Passed - Patient is not pregnant      Passed - Last BP in normal range    BP Readings from Last 1 Encounters:  06/13/24 104/70         Passed - Valid encounter within last 6 months    Recent Outpatient Visits           1 week ago Eustachian tube dysfunction, right   [facility] Aletha Bene, MD   5 months ago Frequent UTI   [facility] Duanne Butler DASEN, MD   1 year ago Routine general medical examination at a health care facility   [facility] Pickard, Butler DASEN, MD

## 2024-09-13 ENCOUNTER — Other Ambulatory Visit: Payer: Self-pay | Admitting: Family Medicine

## 2024-09-13 DIAGNOSIS — I10 Essential (primary) hypertension: Secondary | ICD-10-CM

## 2024-09-15 NOTE — Telephone Encounter (Signed)
 Requested Prescriptions  Pending Prescriptions Disp Refills   lisinopril  (ZESTRIL ) 10 MG tablet [Pharmacy Med Name: LISINOPRIL  10 MG TAB] 90 tablet 1    Sig: TAKE ONE TABLET (10MG  TOTAL) BY MOUTH DAILY     Cardiovascular:  ACE Inhibitors Failed - 09/15/2024  4:54 PM      Failed - Cr in normal range and within 180 days    Creat  Date Value Ref Range Status  01/22/2024 0.87 0.50 - 1.03 mg/dL Final         Failed - K in normal range and within 180 days    Potassium  Date Value Ref Range Status  01/22/2024 4.3 3.5 - 5.3 mmol/L Final         Failed - Valid encounter within last 6 months    Recent Outpatient Visits           3 months ago Eustachian tube dysfunction, right   [facility] Aletha Bene, MD   7 months ago Frequent UTI   [facility] [facility] Duanne Butler DASEN, MD   1 year ago Routine general medical examination at a health care facility   [facility] Duanne Butler DASEN, MD              Passed - Patient is not pregnant      Passed - Last BP in normal range    BP Readings from Last 1 Encounters:  06/13/24 104/70

## 2024-11-23 ENCOUNTER — Telehealth: Admitting: Emergency Medicine

## 2024-11-23 DIAGNOSIS — J069 Acute upper respiratory infection, unspecified: Secondary | ICD-10-CM | POA: Diagnosis not present

## 2024-11-23 NOTE — Progress Notes (Signed)
 We are sorry you are not feeling well.  Here is how we plan to help!  Based on what you have shared with me, it looks like you may have a viral upper respiratory infection.  Upper respiratory infections are caused by a large number of viruses; however, rhinovirus is the most common cause. COVID is also a possibility and I recommend you test yourself for COVID at home.    Symptoms vary from person to person, with common symptoms including sore throat, cough, and fatigue or lack of energy, and a feeling of general discomfort.  A low-grade fever of up to 100.4 may present, but is often uncommon.  Symptoms vary however, and are closely related to a person's age or underlying illnesses.  The most common symptoms associated with an upper respiratory infection are nasal discharge or congestion, cough, sneezing, headache and pressure in the ears and face.  These symptoms usually persist for about 3 to 10 days, but can last up to 2 weeks.  It is important to know that upper respiratory infections do not cause serious illness or complications in most cases.    Upper respiratory infections can be transmitted from person to person, with the most common method of transmission being a person's hands.  The virus is able to live on the skin and can infect other persons for up to 2 hours after direct contact.  Also, these can be transmitted when someone coughs or sneezes; thus, it is important to cover the mouth to reduce this risk.  To keep the spread of the illness at bay, good hand hygiene is very important!  Because this is a viral infection, there are no specific treatments other than to help you with the symptoms until the infection runs its course.  Antibiotics are not recommended by the Infectious Disease Society of [place] unless you have severe symptoms (including high fever) or you have symptoms for more than 10 days. If you still have symptoms after 10 days, antibiotics should be considered.    For nasal  congestion, you may use an oral decongestants such as Mucinex  D or if you have glaucoma or high blood pressure use plain Mucinex .  Saline nasal spray or nasal drops can help and can safely be used as often as needed for congestion.  For your congestion, I have prescribed Azelastine nasal spray two sprays in each nostril twice a day  If you do not have a history of heart disease, hypertension, diabetes or thyroid disease, prostate/bladder issues or glaucoma, you may also use Sudafed to treat nasal congestion.  It is highly recommended that you consult with a pharmacist or your primary care physician to ensure this medication is safe for you to take.     If you have a cough, you may use over-the-counter cough suppressants such as Delsym and Robitussin.  If you have glaucoma or high blood pressure, you can also use Coricidin HBP.   For cough I have prescribed for you A prescription cough medication called Tessalon Perles 100 mg. You may take 1-2 capsules every 8 hours as needed for cough  If you have a sore or scratchy throat, use a saltwater gargle-  to  teaspoon of salt dissolved in a 4-ounce to 8-ounce glass of warm water.  Gargle the solution for approximately 15-30 seconds and then spit.  It is important not to swallow the solution.  You can also use throat lozenges/cough drops and Chloraseptic spray to help with throat pain or discomfort.  Warm  or cold liquids can also be helpful in relieving throat pain.   For headache, pain, or general discomfort, you can use Ibuprofen  or Tylenol as directed.   Some authorities believe that zinc sprays or the use of Echinacea may shorten the course of your symptoms.   HOME CARE Only take medications as instructed by your medical team. Be sure to drink plenty of fluids. Water is fine as well as fruit juices, sodas and electrolyte beverages. You may want to stay away from caffeine or alcohol. If you are nauseated, try taking small sips of liquids. How do you know  if you are getting enough fluid? Your urine should be a pale yellow or almost colorless. Get rest. Taking a steamy shower or using a humidifier may help nasal congestion and ease sore throat pain. You can place a towel over your head and breathe in the steam from hot water coming from a faucet. Using a saline nasal spray works much the same way. Cough drops, hard candies and sore throat lozenges may ease your cough. Avoid close contacts especially the very young and the elderly Cover your mouth if you cough or sneeze Always remember to wash your hands.   GET HELP RIGHT AWAY IF: You develop worsening fever. If your symptoms do not improve within 10 days You develop yellow or green discharge from your nose over 3 days. You have coughing fits You develop a severe head ache or visual changes. You develop shortness of breath, difficulty breathing or start having chest pain Your symptoms persist after you have completed your treatment plan  MAKE SURE YOU  Understand these instructions. Will watch your condition. Will get help right away if you are not doing well or get worse.  Your e-visit answers were reviewed by a board certified advanced clinical practitioner to complete your personal care plan. Depending upon the condition, your plan could have included both over-the-counter or prescription medications.  Please review your pharmacy choice. If there is a problem, you may call our nursing hot line at and have the prescription routed to another pharmacy. Your safety is important to us . If you have drug allergies, check your prescription carefully.   You can use MyChart to ask questions about todays visit, request a non-urgent call back, or ask for a work or school excuse for 24 hours related to this e-Visit. If it has been greater than 24 hours you will need to follow up with your provider, or enter a new e-Visit to address those concerns. You will get an e-mail in the next two days asking  about your experience.  I hope that your e-visit has been valuable and will speed your recovery. Thank you for using e-visits.  I have spent 5 minutes in review of e-visit questionnaire, review and updating patient chart, medical decision making and response to patient.   Jon Belt, PhD, FNP-BC

## 2025-01-23 ENCOUNTER — Encounter: Admitting: Family Medicine
